# Patient Record
Sex: Female | Born: 1955 | Race: White | Hispanic: No | Marital: Married | Smoking: Never smoker
Health system: Southern US, Community
[De-identification: ages and names within clinical notes are randomized; demographics above are authoritative.]

## PROBLEM LIST (undated history)

## (undated) DIAGNOSIS — M8580 Other specified disorders of bone density and structure, unspecified site: Secondary | ICD-10-CM

## (undated) DIAGNOSIS — D219 Benign neoplasm of connective and other soft tissue, unspecified: Secondary | ICD-10-CM

## (undated) DIAGNOSIS — I340 Nonrheumatic mitral (valve) insufficiency: Secondary | ICD-10-CM

## (undated) DIAGNOSIS — C4491 Basal cell carcinoma of skin, unspecified: Secondary | ICD-10-CM

## (undated) HISTORY — DX: Benign neoplasm of connective and other soft tissue, unspecified: D21.9

## (undated) HISTORY — DX: Nonrheumatic mitral (valve) insufficiency: I34.0

## (undated) HISTORY — DX: Other specified disorders of bone density and structure, unspecified site: M85.80

## (undated) HISTORY — DX: Basal cell carcinoma of skin, unspecified: C44.91

---

## 2010-01-21 ENCOUNTER — Other Ambulatory Visit: Admission: RE | Admit: 2010-01-21 | Discharge: 2010-01-21 | Payer: Self-pay | Admitting: Family Medicine

## 2010-02-16 ENCOUNTER — Ambulatory Visit (HOSPITAL_COMMUNITY): Admission: RE | Admit: 2010-02-16 | Discharge: 2010-02-16 | Payer: Self-pay | Admitting: Family Medicine

## 2010-11-13 ENCOUNTER — Encounter: Payer: Self-pay | Admitting: Family Medicine

## 2011-01-17 ENCOUNTER — Other Ambulatory Visit (HOSPITAL_COMMUNITY)
Admission: RE | Admit: 2011-01-17 | Discharge: 2011-01-17 | Disposition: A | Discharge status: Home / Self Care | Payer: Federal, State, Local not specified - PPO | Source: Ambulatory Visit | Attending: Family Medicine | Admitting: Family Medicine

## 2011-01-17 DIAGNOSIS — Z124 Encounter for screening for malignant neoplasm of cervix: Secondary | ICD-10-CM | POA: Insufficient documentation

## 2011-01-26 ENCOUNTER — Other Ambulatory Visit (HOSPITAL_COMMUNITY): Payer: Self-pay | Admitting: Family Medicine

## 2011-01-26 DIAGNOSIS — Z1231 Encounter for screening mammogram for malignant neoplasm of breast: Secondary | ICD-10-CM

## 2011-02-20 ENCOUNTER — Ambulatory Visit (HOSPITAL_COMMUNITY)
Admission: RE | Admit: 2011-02-20 | Discharge: 2011-02-20 | Disposition: A | Discharge status: Home / Self Care | Payer: Federal, State, Local not specified - PPO | Source: Ambulatory Visit | Attending: Family Medicine | Admitting: Family Medicine

## 2011-02-20 DIAGNOSIS — Z1231 Encounter for screening mammogram for malignant neoplasm of breast: Secondary | ICD-10-CM | POA: Insufficient documentation

## 2011-12-22 HISTORY — PX: MOHS SURGERY: SUR867

## 2012-02-06 ENCOUNTER — Other Ambulatory Visit (HOSPITAL_COMMUNITY): Payer: Self-pay | Admitting: Family Medicine

## 2012-02-06 DIAGNOSIS — Z1231 Encounter for screening mammogram for malignant neoplasm of breast: Secondary | ICD-10-CM

## 2012-02-07 ENCOUNTER — Ambulatory Visit (HOSPITAL_COMMUNITY)
Admission: RE | Admit: 2012-02-07 | Discharge: 2012-02-07 | Disposition: A | Discharge status: Home / Self Care | Payer: Federal, State, Local not specified - PPO | Source: Ambulatory Visit | Attending: Family Medicine | Admitting: Family Medicine

## 2012-02-07 DIAGNOSIS — Z1231 Encounter for screening mammogram for malignant neoplasm of breast: Secondary | ICD-10-CM | POA: Insufficient documentation

## 2013-01-08 ENCOUNTER — Other Ambulatory Visit (HOSPITAL_COMMUNITY): Payer: Self-pay | Admitting: Family Medicine

## 2013-01-08 DIAGNOSIS — Z1231 Encounter for screening mammogram for malignant neoplasm of breast: Secondary | ICD-10-CM

## 2013-02-03 ENCOUNTER — Ambulatory Visit (HOSPITAL_COMMUNITY)
Admission: RE | Admit: 2013-02-03 | Discharge: 2013-02-03 | Disposition: A | Discharge status: Home / Self Care | Payer: Federal, State, Local not specified - PPO | Source: Ambulatory Visit | Attending: Family Medicine | Admitting: Family Medicine

## 2013-02-03 DIAGNOSIS — Z1231 Encounter for screening mammogram for malignant neoplasm of breast: Secondary | ICD-10-CM

## 2013-02-13 ENCOUNTER — Ambulatory Visit (HOSPITAL_COMMUNITY): Payer: Federal, State, Local not specified - PPO

## 2013-12-30 ENCOUNTER — Other Ambulatory Visit (HOSPITAL_COMMUNITY)
Admission: RE | Admit: 2013-12-30 | Discharge: 2013-12-30 | Disposition: A | Discharge status: Home / Self Care | Payer: Federal, State, Local not specified - PPO | Source: Ambulatory Visit | Attending: Family Medicine | Admitting: Family Medicine

## 2013-12-30 ENCOUNTER — Other Ambulatory Visit: Payer: Self-pay | Admitting: Family Medicine

## 2013-12-30 DIAGNOSIS — Z1151 Encounter for screening for human papillomavirus (HPV): Secondary | ICD-10-CM | POA: Insufficient documentation

## 2013-12-30 DIAGNOSIS — Z0000 Encounter for general adult medical examination without abnormal findings: Secondary | ICD-10-CM | POA: Insufficient documentation

## 2013-12-31 ENCOUNTER — Other Ambulatory Visit (HOSPITAL_COMMUNITY): Payer: Self-pay | Admitting: Family Medicine

## 2013-12-31 DIAGNOSIS — Z1231 Encounter for screening mammogram for malignant neoplasm of breast: Secondary | ICD-10-CM

## 2014-02-04 ENCOUNTER — Ambulatory Visit (HOSPITAL_COMMUNITY)
Admission: RE | Admit: 2014-02-04 | Discharge: 2014-02-04 | Disposition: A | Discharge status: Home / Self Care | Payer: Federal, State, Local not specified - PPO | Source: Ambulatory Visit | Attending: Family Medicine | Admitting: Family Medicine

## 2014-02-04 DIAGNOSIS — Z1231 Encounter for screening mammogram for malignant neoplasm of breast: Secondary | ICD-10-CM | POA: Insufficient documentation

## 2014-08-26 ENCOUNTER — Encounter: Payer: Self-pay | Admitting: *Deleted

## 2015-01-07 ENCOUNTER — Other Ambulatory Visit (HOSPITAL_COMMUNITY): Payer: Self-pay | Admitting: Family Medicine

## 2015-01-07 DIAGNOSIS — Z1231 Encounter for screening mammogram for malignant neoplasm of breast: Secondary | ICD-10-CM

## 2015-02-09 ENCOUNTER — Ambulatory Visit (HOSPITAL_COMMUNITY)
Admission: RE | Admit: 2015-02-09 | Discharge: 2015-02-09 | Disposition: A | Discharge status: Home / Self Care | Payer: Federal, State, Local not specified - PPO | Source: Ambulatory Visit | Attending: Family Medicine | Admitting: Family Medicine

## 2015-02-09 DIAGNOSIS — Z1231 Encounter for screening mammogram for malignant neoplasm of breast: Secondary | ICD-10-CM | POA: Insufficient documentation

## 2016-01-13 ENCOUNTER — Other Ambulatory Visit: Payer: Self-pay

## 2016-01-13 DIAGNOSIS — Z1231 Encounter for screening mammogram for malignant neoplasm of breast: Secondary | ICD-10-CM

## 2016-02-15 DIAGNOSIS — K080 Exfoliation of teeth due to systemic causes: Secondary | ICD-10-CM | POA: Diagnosis not present

## 2016-02-17 ENCOUNTER — Ambulatory Visit
Admission: RE | Admit: 2016-02-17 | Discharge: 2016-02-17 | Disposition: A | Discharge status: Home / Self Care | Payer: Federal, State, Local not specified - PPO | Source: Ambulatory Visit

## 2016-02-17 DIAGNOSIS — Z1231 Encounter for screening mammogram for malignant neoplasm of breast: Secondary | ICD-10-CM | POA: Diagnosis not present

## 2016-02-24 DIAGNOSIS — Z85828 Personal history of other malignant neoplasm of skin: Secondary | ICD-10-CM | POA: Diagnosis not present

## 2016-02-24 DIAGNOSIS — D2262 Melanocytic nevi of left upper limb, including shoulder: Secondary | ICD-10-CM | POA: Diagnosis not present

## 2016-02-24 DIAGNOSIS — L819 Disorder of pigmentation, unspecified: Secondary | ICD-10-CM | POA: Diagnosis not present

## 2016-02-24 DIAGNOSIS — D2261 Melanocytic nevi of right upper limb, including shoulder: Secondary | ICD-10-CM | POA: Diagnosis not present

## 2016-08-08 DIAGNOSIS — K080 Exfoliation of teeth due to systemic causes: Secondary | ICD-10-CM | POA: Diagnosis not present

## 2016-11-16 DIAGNOSIS — H25813 Combined forms of age-related cataract, bilateral: Secondary | ICD-10-CM | POA: Diagnosis not present

## 2016-11-22 DIAGNOSIS — L821 Other seborrheic keratosis: Secondary | ICD-10-CM | POA: Diagnosis not present

## 2016-11-22 DIAGNOSIS — D1801 Hemangioma of skin and subcutaneous tissue: Secondary | ICD-10-CM | POA: Diagnosis not present

## 2016-11-22 DIAGNOSIS — D2262 Melanocytic nevi of left upper limb, including shoulder: Secondary | ICD-10-CM | POA: Diagnosis not present

## 2016-11-22 DIAGNOSIS — D2222 Melanocytic nevi of left ear and external auricular canal: Secondary | ICD-10-CM | POA: Diagnosis not present

## 2016-11-22 DIAGNOSIS — Z85828 Personal history of other malignant neoplasm of skin: Secondary | ICD-10-CM | POA: Diagnosis not present

## 2016-12-29 DIAGNOSIS — Z131 Encounter for screening for diabetes mellitus: Secondary | ICD-10-CM | POA: Diagnosis not present

## 2016-12-29 DIAGNOSIS — Z13220 Encounter for screening for lipoid disorders: Secondary | ICD-10-CM | POA: Diagnosis not present

## 2016-12-29 DIAGNOSIS — M8588 Other specified disorders of bone density and structure, other site: Secondary | ICD-10-CM | POA: Diagnosis not present

## 2017-01-23 ENCOUNTER — Other Ambulatory Visit: Payer: Self-pay | Admitting: Family Medicine

## 2017-01-23 ENCOUNTER — Other Ambulatory Visit (HOSPITAL_COMMUNITY)
Admission: RE | Admit: 2017-01-23 | Discharge: 2017-01-23 | Disposition: A | Discharge status: Home / Self Care | Payer: Federal, State, Local not specified - PPO | Source: Ambulatory Visit | Attending: Family Medicine | Admitting: Family Medicine

## 2017-01-23 ENCOUNTER — Telehealth: Payer: Self-pay | Admitting: *Deleted

## 2017-01-23 DIAGNOSIS — Z124 Encounter for screening for malignant neoplasm of cervix: Secondary | ICD-10-CM | POA: Insufficient documentation

## 2017-01-23 DIAGNOSIS — Z0000 Encounter for general adult medical examination without abnormal findings: Secondary | ICD-10-CM | POA: Diagnosis not present

## 2017-01-23 DIAGNOSIS — Z23 Encounter for immunization: Secondary | ICD-10-CM | POA: Diagnosis not present

## 2017-01-23 DIAGNOSIS — Z1231 Encounter for screening mammogram for malignant neoplasm of breast: Secondary | ICD-10-CM

## 2017-01-23 NOTE — Telephone Encounter (Signed)
NOTES SENT TO SCHEDULING.  °

## 2017-01-24 ENCOUNTER — Telehealth (HOSPITAL_COMMUNITY): Payer: Self-pay | Admitting: Family Medicine

## 2017-01-26 LAB — CYTOLOGY - PAP: Diagnosis: NEGATIVE

## 2017-01-29 NOTE — Telephone Encounter (Signed)
  01/24/2017 03:27 PM Phone (Outgoing) Katrina Snow, Katrina Snow (Self) [phone] (M)   Left Message - Contacted pt to set up echo but she did not have a voicemail set up.    By Verdene Rio

## 2017-02-01 ENCOUNTER — Telehealth (HOSPITAL_COMMUNITY): Payer: Self-pay | Admitting: Family Medicine

## 2017-02-02 NOTE — Telephone Encounter (Signed)
  02/01/2017 03:10 PM Phone (Outgoing) Katrina Snow, Katrina Snow (Self) [phone] (H)   No Answer/Busy - Called pt was not able to leave a msg ..she has no VM set up.    By Verdene Rio    02/02/2017 01:23 PM Phone (Outgoing) Katrina Snow, Katrina Snow (Self) [phone] (H) Remove  No Answer/Busy - Called pt and was not able to leave msg..no VM was set up    By Verdene Rio

## 2017-02-12 ENCOUNTER — Ambulatory Visit
Admission: RE | Admit: 2017-02-12 | Discharge: 2017-02-12 | Disposition: A | Discharge status: Home / Self Care | Payer: Federal, State, Local not specified - PPO | Source: Ambulatory Visit | Attending: Family Medicine | Admitting: Family Medicine

## 2017-02-12 DIAGNOSIS — Z1231 Encounter for screening mammogram for malignant neoplasm of breast: Secondary | ICD-10-CM | POA: Diagnosis not present

## 2017-02-13 DIAGNOSIS — K080 Exfoliation of teeth due to systemic causes: Secondary | ICD-10-CM | POA: Diagnosis not present

## 2017-04-03 DIAGNOSIS — M8588 Other specified disorders of bone density and structure, other site: Secondary | ICD-10-CM | POA: Diagnosis not present

## 2017-04-05 ENCOUNTER — Telehealth: Payer: Self-pay

## 2017-04-05 NOTE — Telephone Encounter (Signed)
SENT NOTES TO SCHEDULING 

## 2017-05-02 ENCOUNTER — Encounter: Payer: Self-pay | Admitting: Internal Medicine

## 2017-05-02 ENCOUNTER — Ambulatory Visit (INDEPENDENT_AMBULATORY_CARE_PROVIDER_SITE_OTHER): Payer: Federal, State, Local not specified - PPO | Admitting: Internal Medicine

## 2017-05-02 ENCOUNTER — Encounter (INDEPENDENT_AMBULATORY_CARE_PROVIDER_SITE_OTHER): Payer: Self-pay

## 2017-05-02 VITALS — BP 146/70 | HR 79 | Ht 61.5 in | Wt 125.0 lb

## 2017-05-02 DIAGNOSIS — I059 Rheumatic mitral valve disease, unspecified: Secondary | ICD-10-CM

## 2017-05-02 NOTE — Patient Instructions (Addendum)
Your physician recommends that you continue on your current medications as directed. Please refer to the Current Medication list given to you today.  Your physician has requested that you have an echocardiogram. Echocardiography is a painless test that uses sound waves to create images of your heart. It provides your doctor with information about the size and shape of your heart and how well your heart's chambers and valves are working. This procedure takes approximately one hour. There are no restrictions for this procedure.  Follow up with your physician will depend on test results.   Addendum: gave paper request for echo report to medical records.

## 2017-05-02 NOTE — Progress Notes (Signed)
Cardiology Office Note   Date:  05/02/2017   ID:  Katrina Snow, DOB 1956/04/17, MRN [id number]  PCP:  Cari Caraway, MD  Cardiologist:   Dorris Carnes, MD   Pt referred by Dr Addison Lank for eval of mitral regurg   History of Present Illness: Katrina Snow is a 61 y.o. female with a history of mitral regurgitation  She had an echo several years ago  (? At [facility]).    She denies palpitaitons   No SOB  Rare CP  Not associated with activity      Current Meds  Medication Sig  . Calcium-Vitamin D-Vitamin K (VIACTIV) [id number] MG-UNT-MCG CHEW Chew by mouth 2 (two) times daily.  Marland Kitchen ibandronate (BONIVA) 150 MG tablet Take 150 mg by mouth every 30 (thirty) days. Take in the morning with a full glass of water, on an empty stomach, and do not take anything else by mouth or lie down for the next 30 min.     Allergies:   Patient has no known allergies.   Past Medical History:  Diagnosis Date  . Basal cell carcinoma   . Fibroids   . Mitral valve regurgitation    mild to moderate  . Osteopenia     Past Surgical History:  Procedure Laterality Date  . MOHS SURGERY  12/2011   nose for basal cell     Social History:  The patient  reports that she has never smoked. She has never used smokeless tobacco. She reports that she drinks alcohol. She reports that she does not use drugs.   Family History:  The patient's family history includes Bladder Cancer in her mother; Hypertension in her sister; Seizures in her sister.    ROS:  Please see the history of present illness. All other systems are reviewed and  Negative to the above problem except as noted.    PHYSICAL EXAM: VS:  BP (!) 146/70   Pulse 79   Ht 5' 1.5" (1.562 m)   Wt 56.7 kg (125 lb)   LMP 06/21/2010   BMI 23.24 kg/m   GEN: Well nourished, well developed, in no acute distress  HEENT: normal  Neck: no JVD, carotid bruits, or masses Cardiac: RRR;   Gr I/VI systolic mrumur back, rubs, or gallops,no edema  Respiratory:   clear to auscultation bilaterally, normal work of breathing GI: soft, nontender, nondistended, + BS  No hepatomegaly  MS: no deformity Moving all extremities   Skin: warm and dry, no rash Neuro:  Strength and sensation are intact Psych: euthymic mood, full affect   EKG:  EKG is ordered today.  SR 77 bpm     Lipid Panel No results found for: CHOL, TRIG, HDL, CHOLHDL, VLDL, LDLCALC, LDLDIRECT    Wt Readings from Last 3 Encounters:  05/02/17 56.7 kg (125 lb)      ASSESSMENT AND PLAN:  1  Mitral valve dz  Does have mild murmur on exam (back)  Would set up for echo to evaluate severity, chamber size  2  HCM   LDL 104   HDL 58  Cotinue to watch diet, stay active    Probabel f/u in 1 year      Current medicines are reviewed at length with the patient today.  The patient does not have concerns regarding medicines.  Signed, Dorris Carnes, MD  05/02/2017 10:58 AM    [address], [place], [place]  [address] Phone: [phone]; Fax: [phone]

## 2017-05-04 ENCOUNTER — Other Ambulatory Visit: Payer: Self-pay

## 2017-05-04 ENCOUNTER — Ambulatory Visit (HOSPITAL_COMMUNITY): Payer: Federal, State, Local not specified - PPO | Attending: Cardiovascular Disease

## 2017-05-04 DIAGNOSIS — I341 Nonrheumatic mitral (valve) prolapse: Secondary | ICD-10-CM | POA: Diagnosis not present

## 2017-05-04 DIAGNOSIS — I059 Rheumatic mitral valve disease, unspecified: Secondary | ICD-10-CM | POA: Diagnosis not present

## 2017-05-04 DIAGNOSIS — I340 Nonrheumatic mitral (valve) insufficiency: Secondary | ICD-10-CM | POA: Diagnosis not present

## 2017-05-04 DIAGNOSIS — I371 Nonrheumatic pulmonary valve insufficiency: Secondary | ICD-10-CM | POA: Insufficient documentation

## 2017-05-04 DIAGNOSIS — I081 Rheumatic disorders of both mitral and tricuspid valves: Secondary | ICD-10-CM | POA: Insufficient documentation

## 2017-07-31 DIAGNOSIS — Z23 Encounter for immunization: Secondary | ICD-10-CM | POA: Diagnosis not present

## 2017-08-14 DIAGNOSIS — K080 Exfoliation of teeth due to systemic causes: Secondary | ICD-10-CM | POA: Diagnosis not present

## 2017-11-01 DIAGNOSIS — Z85828 Personal history of other malignant neoplasm of skin: Secondary | ICD-10-CM | POA: Diagnosis not present

## 2017-11-01 DIAGNOSIS — L814 Other melanin hyperpigmentation: Secondary | ICD-10-CM | POA: Diagnosis not present

## 2017-11-01 DIAGNOSIS — L821 Other seborrheic keratosis: Secondary | ICD-10-CM | POA: Diagnosis not present

## 2017-11-01 DIAGNOSIS — D1801 Hemangioma of skin and subcutaneous tissue: Secondary | ICD-10-CM | POA: Diagnosis not present

## 2017-11-19 DIAGNOSIS — G43B1 Ophthalmoplegic migraine, intractable: Secondary | ICD-10-CM | POA: Diagnosis not present

## 2017-11-19 DIAGNOSIS — H43813 Vitreous degeneration, bilateral: Secondary | ICD-10-CM | POA: Diagnosis not present

## 2018-01-04 DIAGNOSIS — Z1159 Encounter for screening for other viral diseases: Secondary | ICD-10-CM | POA: Diagnosis not present

## 2018-01-04 DIAGNOSIS — Z13220 Encounter for screening for lipoid disorders: Secondary | ICD-10-CM | POA: Diagnosis not present

## 2018-01-04 DIAGNOSIS — M85852 Other specified disorders of bone density and structure, left thigh: Secondary | ICD-10-CM | POA: Diagnosis not present

## 2018-01-04 DIAGNOSIS — Z131 Encounter for screening for diabetes mellitus: Secondary | ICD-10-CM | POA: Diagnosis not present

## 2018-01-11 DIAGNOSIS — M85852 Other specified disorders of bone density and structure, left thigh: Secondary | ICD-10-CM | POA: Diagnosis not present

## 2018-01-11 DIAGNOSIS — R05 Cough: Secondary | ICD-10-CM | POA: Diagnosis not present

## 2018-01-11 DIAGNOSIS — Z0000 Encounter for general adult medical examination without abnormal findings: Secondary | ICD-10-CM | POA: Diagnosis not present

## 2018-01-21 ENCOUNTER — Other Ambulatory Visit: Payer: Self-pay | Admitting: Family Medicine

## 2018-01-21 DIAGNOSIS — Z1231 Encounter for screening mammogram for malignant neoplasm of breast: Secondary | ICD-10-CM

## 2018-02-04 DIAGNOSIS — Z1211 Encounter for screening for malignant neoplasm of colon: Secondary | ICD-10-CM | POA: Diagnosis not present

## 2018-02-04 DIAGNOSIS — Z01818 Encounter for other preprocedural examination: Secondary | ICD-10-CM | POA: Diagnosis not present

## 2018-02-12 DIAGNOSIS — K080 Exfoliation of teeth due to systemic causes: Secondary | ICD-10-CM | POA: Diagnosis not present

## 2018-02-13 ENCOUNTER — Ambulatory Visit
Admission: RE | Admit: 2018-02-13 | Discharge: 2018-02-13 | Disposition: A | Discharge status: Home / Self Care | Payer: Federal, State, Local not specified - PPO | Source: Ambulatory Visit | Attending: Family Medicine | Admitting: Family Medicine

## 2018-02-13 DIAGNOSIS — Z1231 Encounter for screening mammogram for malignant neoplasm of breast: Secondary | ICD-10-CM | POA: Diagnosis not present

## 2018-03-15 DIAGNOSIS — Z1211 Encounter for screening for malignant neoplasm of colon: Secondary | ICD-10-CM | POA: Diagnosis not present

## 2018-09-03 DIAGNOSIS — Z23 Encounter for immunization: Secondary | ICD-10-CM | POA: Diagnosis not present

## 2018-09-03 DIAGNOSIS — K080 Exfoliation of teeth due to systemic causes: Secondary | ICD-10-CM | POA: Diagnosis not present

## 2018-10-30 DIAGNOSIS — D225 Melanocytic nevi of trunk: Secondary | ICD-10-CM | POA: Diagnosis not present

## 2018-10-30 DIAGNOSIS — D2262 Melanocytic nevi of left upper limb, including shoulder: Secondary | ICD-10-CM | POA: Diagnosis not present

## 2018-10-30 DIAGNOSIS — D485 Neoplasm of uncertain behavior of skin: Secondary | ICD-10-CM | POA: Diagnosis not present

## 2018-10-30 DIAGNOSIS — D1801 Hemangioma of skin and subcutaneous tissue: Secondary | ICD-10-CM | POA: Diagnosis not present

## 2018-10-30 DIAGNOSIS — L570 Actinic keratosis: Secondary | ICD-10-CM | POA: Diagnosis not present

## 2018-10-30 DIAGNOSIS — L821 Other seborrheic keratosis: Secondary | ICD-10-CM | POA: Diagnosis not present

## 2018-10-30 DIAGNOSIS — Z85828 Personal history of other malignant neoplasm of skin: Secondary | ICD-10-CM | POA: Diagnosis not present

## 2018-11-08 DIAGNOSIS — H25813 Combined forms of age-related cataract, bilateral: Secondary | ICD-10-CM | POA: Diagnosis not present

## 2018-12-12 DIAGNOSIS — M85852 Other specified disorders of bone density and structure, left thigh: Secondary | ICD-10-CM | POA: Diagnosis not present

## 2018-12-12 DIAGNOSIS — Z136 Encounter for screening for cardiovascular disorders: Secondary | ICD-10-CM | POA: Diagnosis not present

## 2018-12-26 DIAGNOSIS — R030 Elevated blood-pressure reading, without diagnosis of hypertension: Secondary | ICD-10-CM | POA: Diagnosis not present

## 2018-12-26 DIAGNOSIS — M85852 Other specified disorders of bone density and structure, left thigh: Secondary | ICD-10-CM | POA: Diagnosis not present

## 2018-12-26 DIAGNOSIS — D259 Leiomyoma of uterus, unspecified: Secondary | ICD-10-CM | POA: Diagnosis not present

## 2018-12-26 DIAGNOSIS — Z0001 Encounter for general adult medical examination with abnormal findings: Secondary | ICD-10-CM | POA: Diagnosis not present

## 2019-01-30 ENCOUNTER — Other Ambulatory Visit: Payer: Self-pay | Admitting: Family Medicine

## 2019-01-30 DIAGNOSIS — Z1231 Encounter for screening mammogram for malignant neoplasm of breast: Secondary | ICD-10-CM

## 2019-03-31 ENCOUNTER — Other Ambulatory Visit: Payer: Self-pay

## 2019-03-31 ENCOUNTER — Ambulatory Visit
Admission: RE | Admit: 2019-03-31 | Discharge: 2019-03-31 | Disposition: A | Discharge status: Home / Self Care | Payer: Federal, State, Local not specified - PPO | Source: Ambulatory Visit | Attending: Family Medicine | Admitting: Family Medicine

## 2019-03-31 DIAGNOSIS — Z1231 Encounter for screening mammogram for malignant neoplasm of breast: Secondary | ICD-10-CM

## 2019-08-12 DIAGNOSIS — Z23 Encounter for immunization: Secondary | ICD-10-CM | POA: Diagnosis not present

## 2019-11-06 DIAGNOSIS — Z85828 Personal history of other malignant neoplasm of skin: Secondary | ICD-10-CM | POA: Diagnosis not present

## 2019-11-06 DIAGNOSIS — D2272 Melanocytic nevi of left lower limb, including hip: Secondary | ICD-10-CM | POA: Diagnosis not present

## 2019-11-06 DIAGNOSIS — B351 Tinea unguium: Secondary | ICD-10-CM | POA: Diagnosis not present

## 2019-11-11 DIAGNOSIS — H2513 Age-related nuclear cataract, bilateral: Secondary | ICD-10-CM | POA: Diagnosis not present

## 2019-12-18 DIAGNOSIS — Z13220 Encounter for screening for lipoid disorders: Secondary | ICD-10-CM | POA: Diagnosis not present

## 2019-12-18 DIAGNOSIS — M85852 Other specified disorders of bone density and structure, left thigh: Secondary | ICD-10-CM | POA: Diagnosis not present

## 2019-12-25 ENCOUNTER — Other Ambulatory Visit (HOSPITAL_COMMUNITY)
Admission: RE | Admit: 2019-12-25 | Discharge: 2019-12-25 | Disposition: A | Discharge status: Home / Self Care | Payer: Federal, State, Local not specified - PPO | Source: Ambulatory Visit | Attending: Family Medicine | Admitting: Family Medicine

## 2019-12-25 ENCOUNTER — Other Ambulatory Visit: Payer: Self-pay | Admitting: Family Medicine

## 2019-12-25 DIAGNOSIS — M85852 Other specified disorders of bone density and structure, left thigh: Secondary | ICD-10-CM | POA: Diagnosis not present

## 2019-12-25 DIAGNOSIS — Z13220 Encounter for screening for lipoid disorders: Secondary | ICD-10-CM | POA: Diagnosis not present

## 2019-12-25 DIAGNOSIS — I10 Essential (primary) hypertension: Secondary | ICD-10-CM | POA: Diagnosis not present

## 2019-12-25 DIAGNOSIS — Z23 Encounter for immunization: Secondary | ICD-10-CM | POA: Diagnosis not present

## 2019-12-25 DIAGNOSIS — Z0000 Encounter for general adult medical examination without abnormal findings: Secondary | ICD-10-CM | POA: Diagnosis not present

## 2019-12-25 DIAGNOSIS — Z124 Encounter for screening for malignant neoplasm of cervix: Secondary | ICD-10-CM | POA: Diagnosis not present

## 2019-12-25 DIAGNOSIS — M8580 Other specified disorders of bone density and structure, unspecified site: Secondary | ICD-10-CM

## 2019-12-25 DIAGNOSIS — I340 Nonrheumatic mitral (valve) insufficiency: Secondary | ICD-10-CM | POA: Diagnosis not present

## 2019-12-31 LAB — CYTOLOGY - PAP
Comment: NEGATIVE
Diagnosis: NEGATIVE
High risk HPV: NEGATIVE

## 2020-01-09 ENCOUNTER — Other Ambulatory Visit: Payer: Self-pay | Admitting: Family Medicine

## 2020-01-09 DIAGNOSIS — Z1231 Encounter for screening mammogram for malignant neoplasm of breast: Secondary | ICD-10-CM

## 2020-03-31 ENCOUNTER — Other Ambulatory Visit: Payer: Self-pay

## 2020-03-31 ENCOUNTER — Ambulatory Visit
Admission: RE | Admit: 2020-03-31 | Discharge: 2020-03-31 | Disposition: A | Discharge status: Home / Self Care | Payer: Federal, State, Local not specified - PPO | Source: Ambulatory Visit | Attending: Family Medicine | Admitting: Family Medicine

## 2020-03-31 DIAGNOSIS — M8580 Other specified disorders of bone density and structure, unspecified site: Secondary | ICD-10-CM

## 2020-03-31 DIAGNOSIS — Z780 Asymptomatic menopausal state: Secondary | ICD-10-CM | POA: Diagnosis not present

## 2020-03-31 DIAGNOSIS — M8589 Other specified disorders of bone density and structure, multiple sites: Secondary | ICD-10-CM | POA: Diagnosis not present

## 2020-03-31 DIAGNOSIS — Z1231 Encounter for screening mammogram for malignant neoplasm of breast: Secondary | ICD-10-CM

## 2020-08-10 IMAGING — MG DIGITAL SCREENING BILAT W/ TOMO W/ CAD
8 series · 9 of 24 positions shown · non-contrast
Comparison: Previous exam(s).

CLINICAL DATA: Screening.

EXAM:
DIGITAL SCREENING BILATERAL MAMMOGRAM WITH TOMO AND CAD

[L MLO synth-2D]
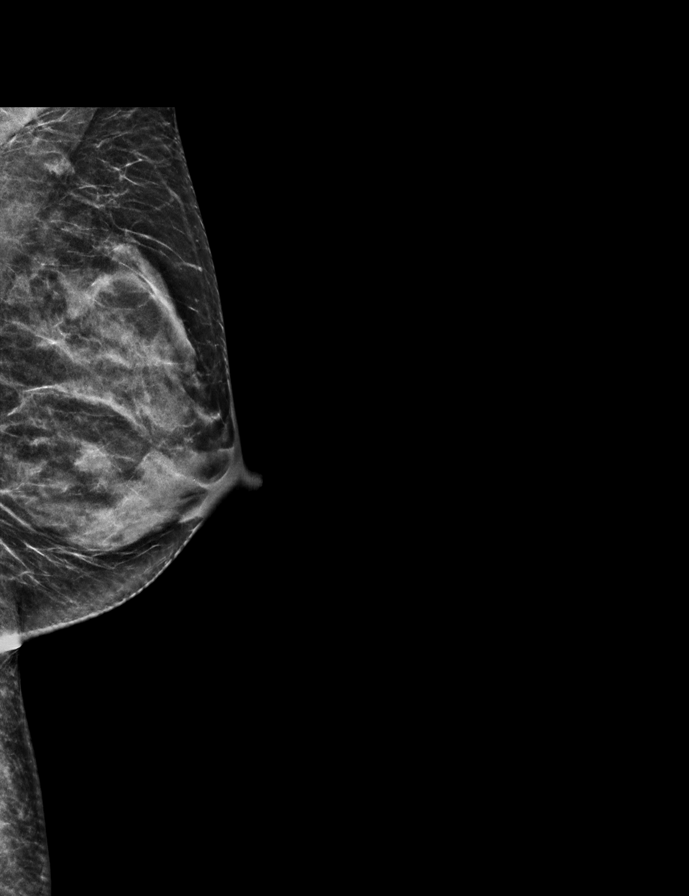

[R CC synth-2D]
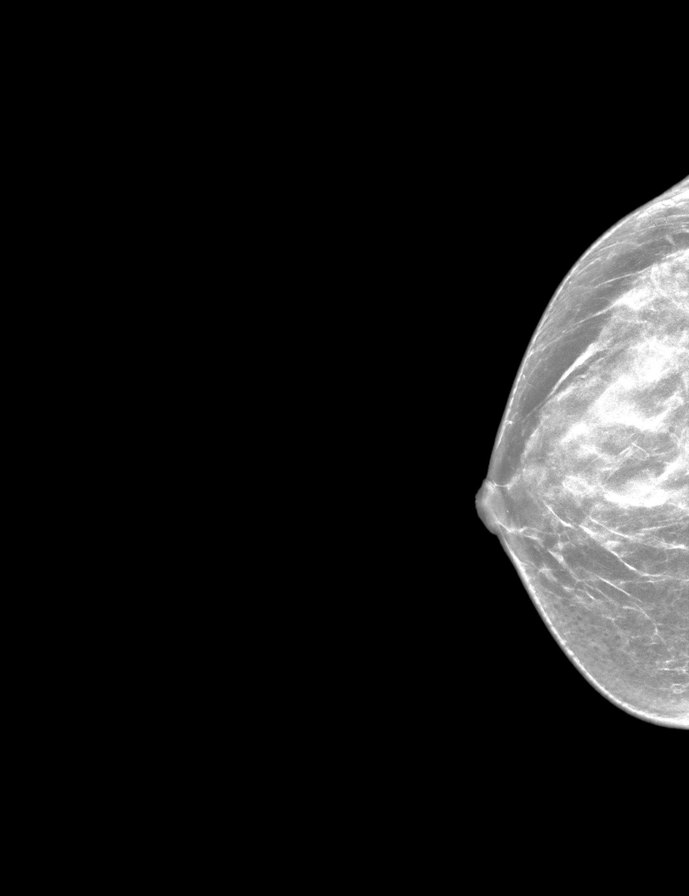

[R MLO synth-2D]
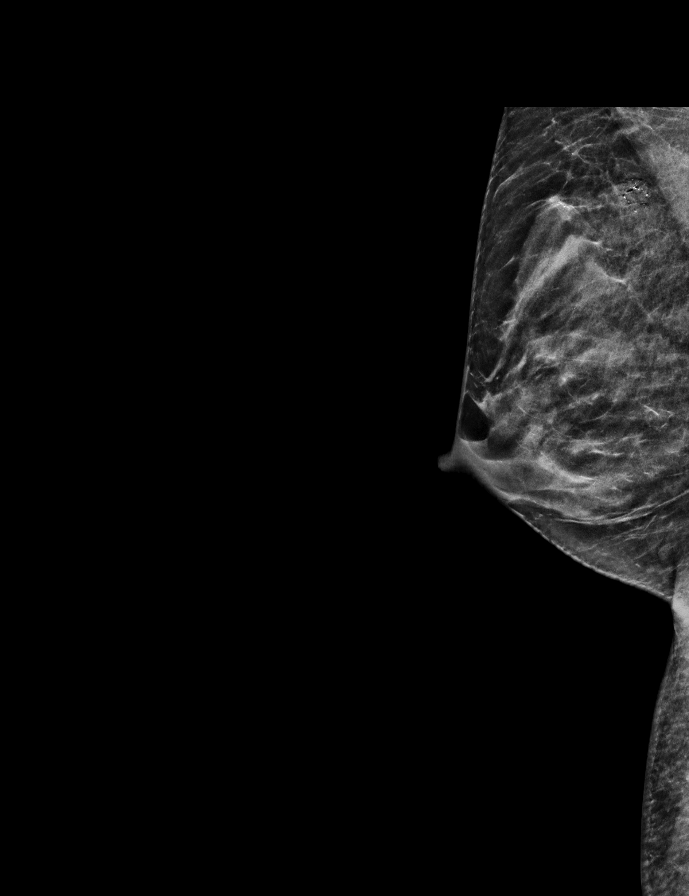

[L CC synth-2D]
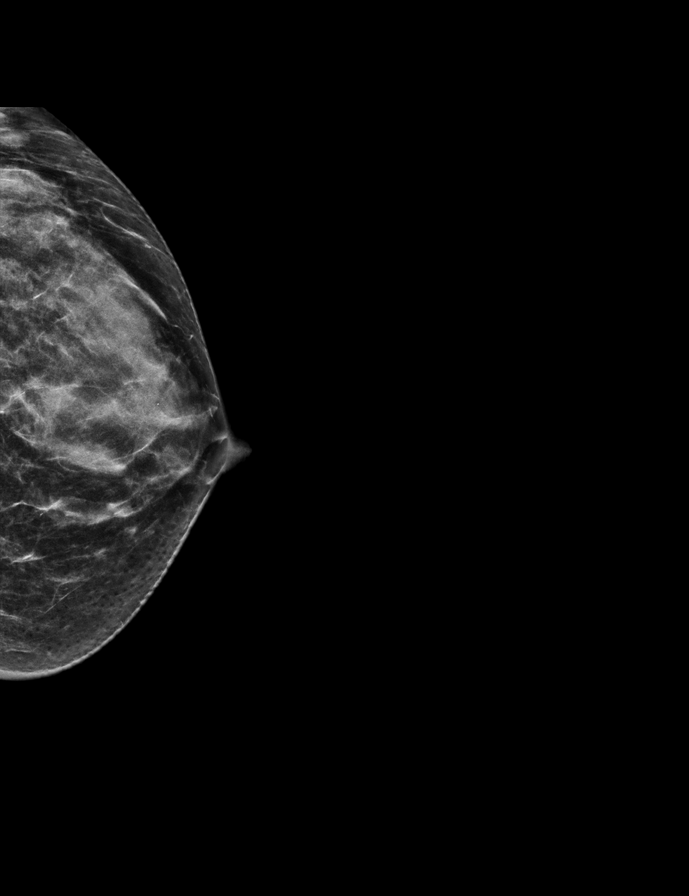

[L MLO tomo · 2 of 52 frames shown]
[frame 17/52]
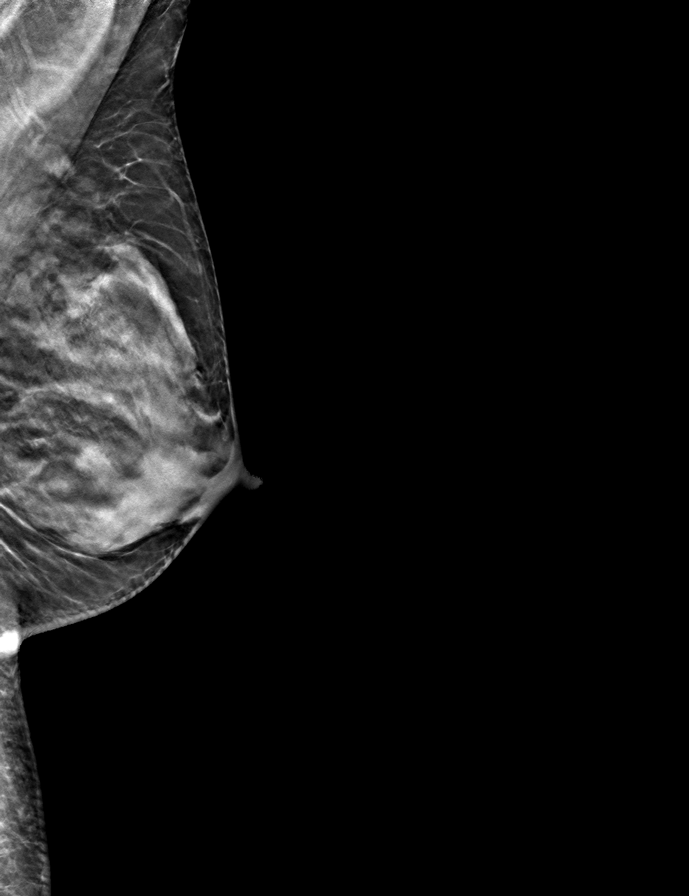
[frame 27/52]
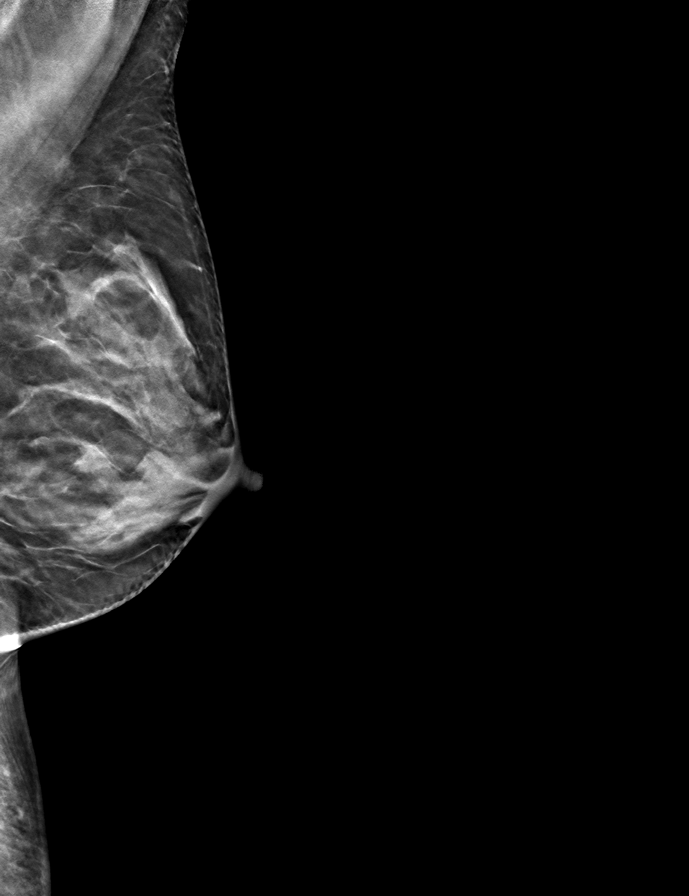

[R MLO tomo · tomo slice 26/51.0]
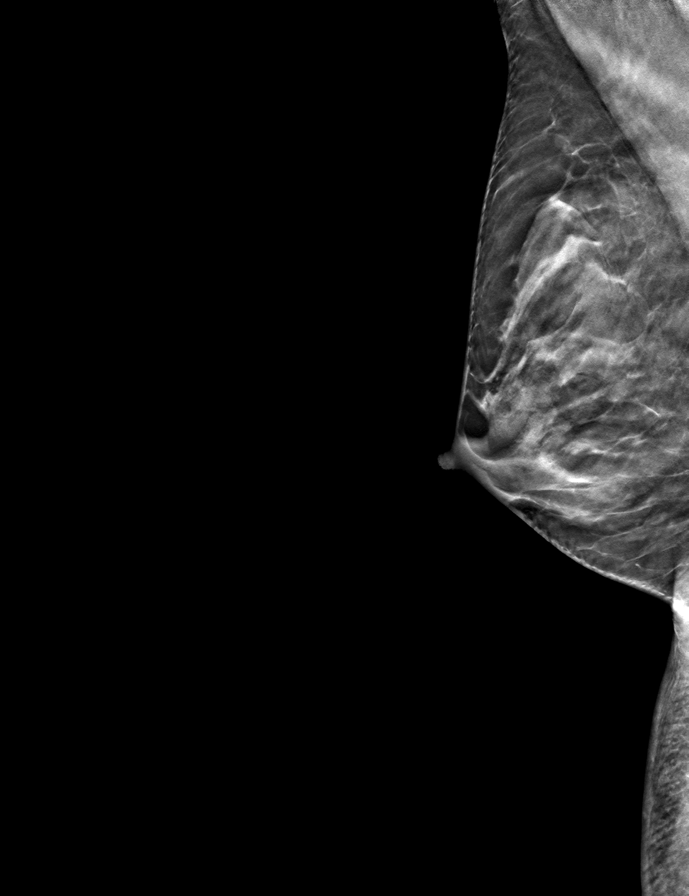

[L CC tomo · tomo slice 27/53.0]
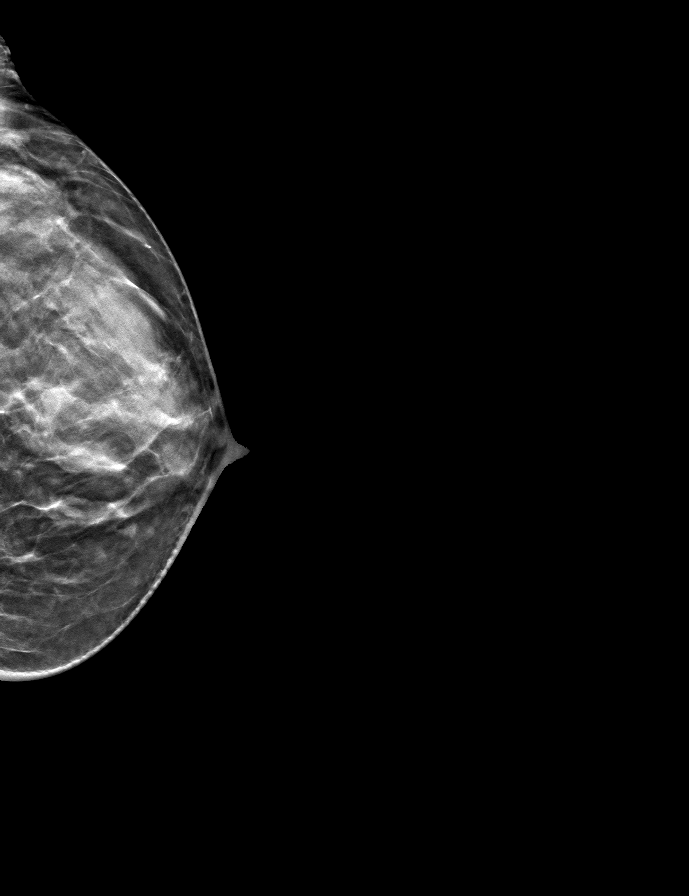

[R CC tomo · tomo slice 29/56.0]
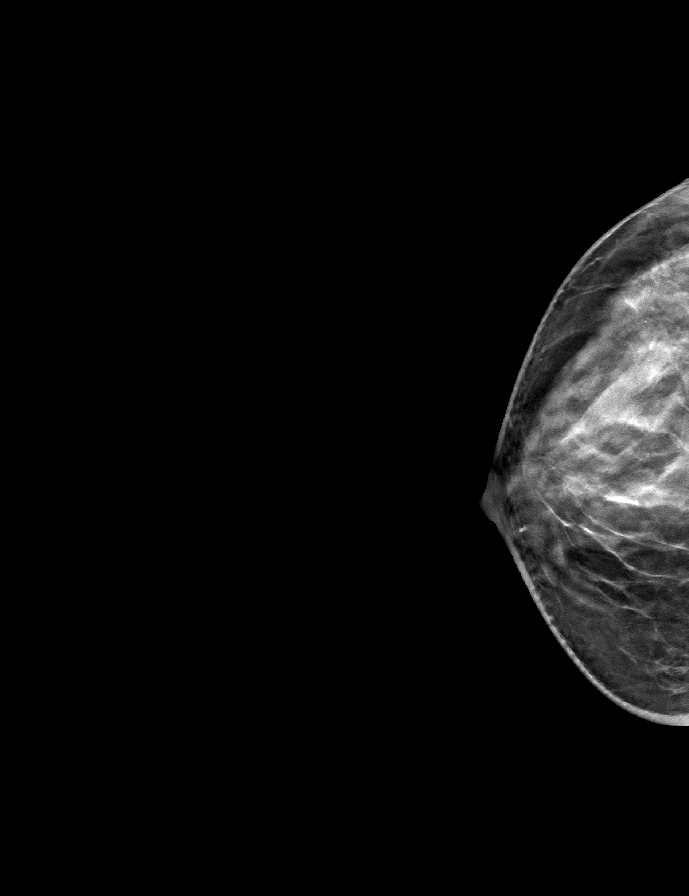

[9 of 24 positions shown; findings below may reference images not displayed]

ACR Breast Density Category c: The breast tissue is heterogeneously
dense, which may obscure small masses.
FINDINGS: There are no findings suspicious for malignancy. Images were
processed with CAD.
IMPRESSION: No mammographic evidence of malignancy. A result letter of this
screening mammogram will be mailed directly to the patient.

RECOMMENDATION:
Screening mammogram in one year. (Code:[id number])

BI-RADS CATEGORY  1: Negative.

## 2020-11-02 DIAGNOSIS — H2513 Age-related nuclear cataract, bilateral: Secondary | ICD-10-CM | POA: Diagnosis not present

## 2020-11-02 DIAGNOSIS — H5213 Myopia, bilateral: Secondary | ICD-10-CM | POA: Diagnosis not present

## 2020-11-04 DIAGNOSIS — L814 Other melanin hyperpigmentation: Secondary | ICD-10-CM | POA: Diagnosis not present

## 2020-11-04 DIAGNOSIS — L820 Inflamed seborrheic keratosis: Secondary | ICD-10-CM | POA: Diagnosis not present

## 2020-11-04 DIAGNOSIS — L570 Actinic keratosis: Secondary | ICD-10-CM | POA: Diagnosis not present

## 2020-11-04 DIAGNOSIS — Z85828 Personal history of other malignant neoplasm of skin: Secondary | ICD-10-CM | POA: Diagnosis not present

## 2020-11-04 DIAGNOSIS — L821 Other seborrheic keratosis: Secondary | ICD-10-CM | POA: Diagnosis not present

## 2020-11-04 DIAGNOSIS — D1801 Hemangioma of skin and subcutaneous tissue: Secondary | ICD-10-CM | POA: Diagnosis not present

## 2020-12-20 DIAGNOSIS — I10 Essential (primary) hypertension: Secondary | ICD-10-CM | POA: Diagnosis not present

## 2020-12-20 DIAGNOSIS — R7301 Impaired fasting glucose: Secondary | ICD-10-CM | POA: Diagnosis not present

## 2020-12-20 DIAGNOSIS — M85852 Other specified disorders of bone density and structure, left thigh: Secondary | ICD-10-CM | POA: Diagnosis not present

## 2020-12-20 DIAGNOSIS — Z13220 Encounter for screening for lipoid disorders: Secondary | ICD-10-CM | POA: Diagnosis not present

## 2020-12-30 DIAGNOSIS — D259 Leiomyoma of uterus, unspecified: Secondary | ICD-10-CM | POA: Diagnosis not present

## 2020-12-30 DIAGNOSIS — R7303 Prediabetes: Secondary | ICD-10-CM | POA: Diagnosis not present

## 2020-12-30 DIAGNOSIS — I10 Essential (primary) hypertension: Secondary | ICD-10-CM | POA: Diagnosis not present

## 2020-12-30 DIAGNOSIS — Z0000 Encounter for general adult medical examination without abnormal findings: Secondary | ICD-10-CM | POA: Diagnosis not present

## 2020-12-30 DIAGNOSIS — M85852 Other specified disorders of bone density and structure, left thigh: Secondary | ICD-10-CM | POA: Diagnosis not present

## 2021-02-15 ENCOUNTER — Other Ambulatory Visit: Payer: Self-pay | Admitting: Family Medicine

## 2021-02-15 DIAGNOSIS — Z1231 Encounter for screening mammogram for malignant neoplasm of breast: Secondary | ICD-10-CM

## 2021-02-23 DIAGNOSIS — R059 Cough, unspecified: Secondary | ICD-10-CM | POA: Diagnosis not present

## 2021-04-05 ENCOUNTER — Other Ambulatory Visit: Payer: Self-pay

## 2021-04-05 ENCOUNTER — Ambulatory Visit
Admission: RE | Admit: 2021-04-05 | Discharge: 2021-04-05 | Disposition: A | Discharge status: Home / Self Care | Payer: Medicare Other | Source: Ambulatory Visit | Attending: Family Medicine | Admitting: Family Medicine

## 2021-04-05 DIAGNOSIS — Z1231 Encounter for screening mammogram for malignant neoplasm of breast: Secondary | ICD-10-CM

## 2022-01-05 ENCOUNTER — Other Ambulatory Visit: Payer: Self-pay | Admitting: Family Medicine

## 2022-01-25 ENCOUNTER — Ambulatory Visit (INDEPENDENT_AMBULATORY_CARE_PROVIDER_SITE_OTHER): Payer: Medicare Other | Admitting: Pulmonary Disease

## 2022-01-25 ENCOUNTER — Encounter: Payer: Self-pay | Admitting: Pulmonary Disease

## 2022-01-25 ENCOUNTER — Ambulatory Visit (INDEPENDENT_AMBULATORY_CARE_PROVIDER_SITE_OTHER): Payer: Medicare Other

## 2022-01-25 VITALS — BP 128/88 | HR 84 | Ht 61.5 in | Wt 116.4 lb

## 2022-01-25 DIAGNOSIS — R051 Acute cough: Secondary | ICD-10-CM

## 2022-01-25 DIAGNOSIS — R053 Chronic cough: Secondary | ICD-10-CM | POA: Diagnosis not present

## 2022-01-25 MED ORDER — PANTOPRAZOLE SODIUM 40 MG PO TBEC: 40.0000 mg | DELAYED_RELEASE_TABLET | Freq: Every day | ORAL | 0 refills | Status: DC

## 2022-01-25 NOTE — Patient Instructions (Addendum)
Chronic cough ?--ORDER CXR ?--ORDER pulmonary function tests ?--Reflux: START omeprazole 40 mg once a day. Buy over-the-counter ? ?Follow-up with me in late May. PFTs prior to visit. Ok to schedule separately ?

## 2022-01-25 NOTE — Progress Notes (Signed)
6 ? ? ?Subjective:  ? ?PATIENT ID: Katrina Snow GENDER: female DOB: February 15, 1956, MRN: [id number] ? ? ?HPI ? ?Chief Complaint  ?Patient presents with  ? Consult  ?  Happens mostly when she's sitting, posible acid reflux   ? ? ?Reason for Visit: New consult for cough ? ?Ms. Katrina Snow is a 66 year old female never smoker with osteopenia, hypertension nonrheumatic mitral regurgitation depression who presents for evaluation of cough. ? ?She reports longstanding cough for several years. Has tried pepcid a long time ago a month ago. Didn't work. Attempted diet changes to reduced acidic foods and caffeine. Denies reflux symptoms. Cough seems to occur with sitting for spells lasting 5 minutes. Does seem to occur with standing. Will have hacking cough intermittently during the day. Denies wheezing or shortness of breath. Denies nocturnal symptoms however notes she is on several pillows and usually has difficulty sleeping. She works 160 minutes a week.  Will have to sit at the back of church due to coughing. Worries about her symptoms when going to a show. Denies nasal congestion. Denies chronic respiratory infection. ? 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 ?I have personally reviewed patient's past medical/family/social history, allergies, current medications. ? ?Past Medical History:  ?Diagnosis Date  ? Basal cell carcinoma   ? Fibroids   ? Mitral valve regurgitation   ? mild to moderate  ? Osteopenia   ?  ? ?Family History  ?Problem Relation Age of Onset  ? Bladder Cancer Mother   ? Breast cancer Mother 82  ? Seizures Sister   ? Hypertension Sister   ? Anemia Neg Hx   ? Arrhythmia Neg Hx   ? Asthma Neg Hx   ? Clotting disorder Neg Hx   ? Fainting Neg Hx   ? Heart attack Neg Hx   ? Heart disease Neg Hx   ? Heart failure Neg Hx   ? Hyperlipidemia Neg Hx   ?  ? ?Social History  ? ?Occupational History  ? Not on file  ?Tobacco Use  ? Smoking status: Never  ? Smokeless tobacco: Never  ?Substance and Sexual  Activity  ? Alcohol use: Yes  ?  Alcohol/week: 0.0 standard drinks  ? Drug use: No  ? Sexual activity: Not on file  ? ? ?No Known Allergies  ? ?Outpatient Medications Prior to Visit  ?Medication Sig Dispense Refill  ? hydrochlorothiazide (HYDRODIURIL) 25 MG tablet TAKE 1 TABLET BY MOUTH EVERY DAY IN THE MORNING FOR 90 DAYS    ? Calcium-Vitamin D-Vitamin K [phone] MG-UNT-MCG CHEW Chew by mouth 2 (two) times daily.    ? ibandronate (BONIVA) 150 MG tablet Take 150 mg by mouth every 30 (thirty) days. Take in the morning with a full glass of water, on an empty stomach, and do not take anything else by mouth or lie down for the next 30 min.    ? ?No facility-administered medications prior to visit.  ? ? ?Review of Systems  ?Constitutional:  Negative for chills, diaphoresis, fever, malaise/fatigue and weight loss.  ?HENT:  Negative for congestion.   ?Respiratory:  Positive for cough. Negative for hemoptysis, sputum production, shortness of breath and wheezing.   ?Cardiovascular:  Negative for chest pain, palpitations and leg swelling.  ? ? ?Objective:  ? ?Vitals:  ? 01/25/22 1134  ?BP: 128/88  ?Pulse: 84  ?SpO2: 100%  ?Weight: 116 lb 6.4 oz (52.8 kg)  ?Height: 5' 1.5" (1.562 m)  ? ?SpO2: 100 % ?O2 Device: None (Room air) ? ?Physical  Exam: ?General: Well-appearing, no acute distress ?HENT: [place], AT ?Eyes: EOMI, no scleral icterus ?Respiratory: Clear to auscultation bilaterally.  No crackles, wheezing or rales ?Cardiovascular: RRR, -M/R/G, no JVD ?Extremities:-Edema,-tenderness ?Neuro: AAO x4, CNII-XII grossly intact ?Psych: Normal mood, normal affect ? ?Data Reviewed: ? ?Imaging: ?CXR 01/25/22 - Minimal bronchitic changes. Overall normal ? ?PFT: ?None on file ? ?Labs: ?CBC ?No results found for: WBC, RBC, HGB, HCT, PLT, MCV, MCH, MCHC, RDW, LYMPHSABS, MONOABS, EOSABS, BASOSABS ? ?Health Maintenance: ?Immunization History  ?Administered Date(s) Administered  ? Influenza Split 08/12/2020, 08/11/2021  ? Influenza,inj,Quad PF,6+  Mos 07/31/2017, 09/03/2018, 08/12/2019  ? PFIZER(Purple Top)SARS-COV-2 Vaccination 01/05/2020, 01/26/2020, 07/27/2020, 01/29/2021  ? PNEUMOCOCCAL CONJUGATE-20 01/02/2022  ? Pension scheme manager 6yr & up 07/26/2021  ? Tdap 01/03/2010, 12/25/2019  ? Zoster, Live 01/23/2017, 07/16/2017  ? ?Assessment & Plan:  ? ?Discussion: ?66year old female never smoker with osteopenia, hypertension nonrheumatic mitral regurgitation depression who presents for evaluation of cough. Common causes of cough were discussed including upper airway cough syndrome, reflux and undiagnosed obstructive lung disease. We reviewed evaluation and management for cough as noted below. Reviewed CXR which is overall normal. Discussed management for silent reflux and evaluation of lung function ? ?Chronic cough ?--ORDER CXR ?--ORDER pulmonary function tests ?--Reflux: START omeprazole 40 mg once a day. Buy over-the-counter ? ?Orders Placed This Encounter  ?Procedures  ? DG Chest 2 View  ?  Rule out hiatal hernia  ?  Standing Status:   Future  ?  Standing Expiration Date:   01/26/2023  ?  Order Specific Question:   Reason for Exam (SYMPTOM  OR DIAGNOSIS REQUIRED)  ?  Answer:   cough  ?  Order Specific Question:   Preferred imaging location?  ?  Answer:   Internal  ? ?Meds ordered this encounter  ?Medications  ? pantoprazole (PROTONIX) 40 MG tablet  ?  Sig: Take 1 tablet (40 mg total) by mouth daily.  ?  Dispense:  90 tablet  ?  Refill:  0  ? ? ?Return in about 2 months (around 03/27/2022). ? ?I have spent a total time of 45-minutes on the day of the appointment reviewing prior documentation, coordinating care and discussing medical diagnosis and plan with the patient/family. Imaging, labs and tests included in this note have been reviewed and interpreted independently by me. ? ?Cullen Vanallen JRodman Pickle MD ?[facility] ?01/25/2022 12:01 PM  ?Office Number [phone]? ? ?

## 2022-01-26 ENCOUNTER — Other Ambulatory Visit: Payer: Self-pay | Admitting: Pulmonary Disease

## 2022-01-30 ENCOUNTER — Other Ambulatory Visit (HOSPITAL_COMMUNITY): Payer: Self-pay

## 2022-01-31 ENCOUNTER — Other Ambulatory Visit (HOSPITAL_COMMUNITY): Payer: Self-pay

## 2022-02-01 ENCOUNTER — Encounter: Payer: Self-pay | Admitting: Pulmonary Disease

## 2022-02-01 NOTE — Telephone Encounter (Signed)
Please call pharmacy and cancel omeprazole prescription ?

## 2022-02-01 NOTE — Telephone Encounter (Signed)
ERROR

## 2022-02-01 NOTE — Telephone Encounter (Signed)
Rx for omeprazole '40mg'$  has been sent to pharmacy in place of lansoprazole. Nothing further needed. ?

## 2022-02-01 NOTE — Telephone Encounter (Signed)
Called [facility] and canceled Omeperzole. Notified pt via My Chart. Nothing further needed at this time.  ?

## 2022-02-01 NOTE — Telephone Encounter (Signed)
Dr. Florina Ou please advise on the following My Chart message:  ?[facility] (supporting Margaretha Seeds, MD) 9 minutes ago (11:17 AM)  ? ?I started taking Pantoprazole last week following my visit with you.  Today I received a call from the pharmacy to pick up Omeprazole.  Did you want me to take both medications?  That seems like a lot of reflux meds at one time.  Please advise.  Thank you ? ?Thank you  ?

## 2022-02-02 ENCOUNTER — Encounter: Payer: Self-pay | Admitting: Pulmonary Disease

## 2022-03-01 ENCOUNTER — Ambulatory Visit (INDEPENDENT_AMBULATORY_CARE_PROVIDER_SITE_OTHER): Payer: Medicare Other | Admitting: Pulmonary Disease

## 2022-03-01 DIAGNOSIS — R053 Chronic cough: Secondary | ICD-10-CM

## 2022-03-01 LAB — PULMONARY FUNCTION TEST
DL/VA % pred: 110 %
DL/VA: 4.71 ml/min/mmHg/L
DLCO cor % pred: 108 %
DLCO cor: 19.8 ml/min/mmHg
DLCO unc % pred: 108 %
DLCO unc: 19.8 ml/min/mmHg
FEF 25-75 Post: 2.28 L/sec
FEF 25-75 Pre: 1.77 L/sec
FEF2575-%Change-Post: 28 %
FEF2575-%Pred-Post: 114 %
FEF2575-%Pred-Pre: 88 %
FEV1-%Change-Post: 7 %
FEV1-%Pred-Post: 96 %
FEV1-%Pred-Pre: 89 %
FEV1-Post: 2.1 L
FEV1-Pre: 1.96 L
FEV1FVC-%Change-Post: 3 %
FEV1FVC-%Pred-Pre: 102 %
FEV6-%Change-Post: 3 %
FEV6-%Pred-Post: 93 %
FEV6-%Pred-Pre: 90 %
FEV6-Post: 2.56 L
FEV6-Pre: 2.49 L
FEV6FVC-%Pred-Post: 104 %
FEV6FVC-%Pred-Pre: 104 %
FVC-%Change-Post: 3 %
FVC-%Pred-Post: 89 %
FVC-%Pred-Pre: 86 %
FVC-Post: 2.56 L
FVC-Pre: 2.49 L
Post FEV1/FVC ratio: 82 %
Post FEV6/FVC ratio: 100 %
Pre FEV1/FVC ratio: 79 %
Pre FEV6/FVC Ratio: 100 %
RV % pred: 81 %
RV: 1.6 L
TLC % pred: 86 %
TLC: 4.07 L

## 2022-03-01 NOTE — Patient Instructions (Signed)
Performed Full PFT Today.   ?

## 2022-03-01 NOTE — Progress Notes (Signed)
Performed Full PFT Today.   ?

## 2022-03-13 ENCOUNTER — Ambulatory Visit (INDEPENDENT_AMBULATORY_CARE_PROVIDER_SITE_OTHER): Payer: Medicare Other | Admitting: Pulmonary Disease

## 2022-03-13 ENCOUNTER — Encounter: Payer: Self-pay | Admitting: Pulmonary Disease

## 2022-03-13 VITALS — BP 124/70 | HR 82 | Temp 98.3°F | Ht 61.0 in | Wt 116.0 lb

## 2022-03-13 DIAGNOSIS — R053 Chronic cough: Secondary | ICD-10-CM | POA: Diagnosis not present

## 2022-03-13 MED ORDER — FLUTICASONE FUROATE-VILANTEROL 100-25 MCG/ACT IN AEPB: 1.0000 | INHALATION_SPRAY | Freq: Every day | RESPIRATORY_TRACT | 1 refills | Status: DC

## 2022-03-13 NOTE — Progress Notes (Signed)
6   Subjective:   PATIENT ID: Katrina Snow GENDER: female DOB: 1956-10-14, MRN: [id number]   HPI  Chief Complaint  Patient presents with   Follow-up    PFT results    Reason for Visit: Follow-up  Ms. Katrina Snow is a 66 year old female never smoker with osteopenia, hypertension nonrheumatic mitral regurgitation depression who presents for follow-up.  Initial consult She reports longstanding cough for several years. Has tried pepcid a long time ago a month ago. Didn't work. Attempted diet changes to reduced acidic foods and caffeine. Denies reflux symptoms. Cough seems to occur with sitting for spells lasting 5 minutes. Does seem to occur with standing. Will have hacking cough intermittently during the day. Denies wheezing or shortness of breath. Denies nocturnal symptoms however notes she is on several pillows and usually has difficulty sleeping. She works 160 minutes a week.  Will have to sit at the back of church due to coughing. Worries about her symptoms when going to a show. Denies nasal congestion. Denies chronic respiratory infection.  03/13/22 Since our last visit her cough has improved with less frequency with minor spells related to throat irritation. Usually occurs in the morning. Still has some night cough 1-2 times a month.  Social History: Retired Electrical engineer   Past Medical History:  Diagnosis Date   Basal cell carcinoma    Fibroids    Mitral valve regurgitation    mild to moderate   Osteopenia      Family History  Problem Relation Age of Onset   Bladder Cancer Mother    Breast cancer Mother 52   Seizures Sister    Hypertension Sister    Anemia Neg Hx    Arrhythmia Neg Hx    Asthma Neg Hx    Clotting disorder Neg Hx    Fainting Neg Hx    Heart attack Neg Hx    Heart disease Neg Hx    Heart failure Neg Hx    Hyperlipidemia Neg Hx      Social History   Occupational History   Not on file  Tobacco Use   Smoking status: Never    Smokeless tobacco: Never  Substance and Sexual Activity   Alcohol use: Yes    Alcohol/week: 0.0 standard drinks   Drug use: No   Sexual activity: Not on file    Allergies  Allergen Reactions   Hydrochlorothiazide      Outpatient Medications Prior to Visit  Medication Sig Dispense Refill   hydrochlorothiazide (HYDRODIURIL) 25 MG tablet TAKE 1 TABLET BY MOUTH EVERY DAY IN THE MORNING FOR 90 DAYS     omeprazole (PRILOSEC) 40 MG capsule Take 1 capsule (40 mg total) by mouth daily. 30 capsule 5   Calcium-Vitamin D-Vitamin K [phone] MG-UNT-MCG CHEW Chew by mouth 2 (two) times daily.     ibandronate (BONIVA) 150 MG tablet Take 150 mg by mouth every 30 (thirty) days. Take in the morning with a full glass of water, on an empty stomach, and do not take anything else by mouth or lie down for the next 30 min.     No facility-administered medications prior to visit.    Review of Systems  Constitutional:  Negative for chills, diaphoresis, fever, malaise/fatigue and weight loss.  HENT:  Negative for congestion.   Respiratory:  Positive for cough. Negative for hemoptysis, sputum production, shortness of breath and wheezing.   Cardiovascular:  Negative for chest pain, palpitations and leg swelling.    Objective:   Vitals:  03/13/22 1052  BP: 124/70  Pulse: 82  Temp: 98.3 F (36.8 C)  TempSrc: Oral  SpO2: 99%  Weight: 116 lb (52.6 kg)  Height: '5\' 1"'$  (1.549 m)    SpO2: 99 %  Physical Exam: General: Well-appearing, no acute distress HENT: [place], AT Eyes: EOMI, no scleral icterus Respiratory: Clear to auscultation bilaterally.  No crackles, wheezing or rales Cardiovascular: RRR, -M/R/G, no JVD Extremities:-Edema,-tenderness Neuro: AAO x4, CNII-XII grossly intact Psych: Normal mood, normal affect  Data Reviewed:  Imaging: CXR 01/25/22 - Minimal bronchitic changes. Overall normal  PFT: 03/01/22 FVC 2.56 (89%) FEV1 2.1 (96%) Ratio 79  TLC 86% DLCO 108%. Partial bronchodilator  response however not significant. This does not preclude benefit of inhalers if perceived. Interpretation: Normal PFTs   Labs: CBC No results found for: WBC, RBC, HGB, HCT, PLT, MCV, MCH, MCHC, RDW, LYMPHSABS, MONOABS, EOSABS, BASOSABS  Health Maintenance: Immunization History  Administered Date(s) Administered   Influenza Split 08/12/2020, 08/11/2021   Influenza,inj,Quad PF,6+ Mos 07/31/2017, 09/03/2018, 08/12/2019   PFIZER(Purple Top)SARS-COV-2 Vaccination 01/05/2020, 01/26/2020, 07/27/2020, 01/29/2021   PNEUMOCOCCAL CONJUGATE-20 01/02/2022   Pfizer Covid-19 Vaccine Bivalent Booster 59yr & up 07/26/2021, 03/06/2022   Tdap 01/03/2010, 12/25/2019   Zoster, Live 01/23/2017, 07/16/2017   Assessment & Plan:   Discussion: 66year old female never smoker with osteopenia, HTN, nonrheumatic mitral regurgitation, depression who presents for follow-up for cough. PFTs reviewed with no evidence of obstructive or restrictive defect. Partial bronchodilator response present however not significant for diagnosis of asthma. May still benefit from ICS/LABA trial. Will continue PPI as well until prescription is complete. Will arrange follow-up however if symptoms resolve, ok to cancel.  Chronic cough --START Breo 100-25 ONE puff ONCE a day --CONTINUE omeprazole 40 mg once a day. Buy over-the-counter  No orders of the defined types were placed in this encounter.  Meds ordered this encounter  Medications   fluticasone furoate-vilanterol (BREO ELLIPTA) 100-25 MCG/ACT AEPB    Sig: Inhale 1 puff into the lungs daily.    Dispense:  60 each    Refill:  1    Return in about 3 months (around 06/13/2022).  I have spent a total time of 32-minutes on the day of the appointment including chart review, data review, collecting history, coordinating care and discussing medical diagnosis and plan with the patient/family. Past medical history, allergies, medications were reviewed. Pertinent imaging, labs and  tests included in this note have been reviewed and interpreted independently by me.  CHenderson MD [facility] 03/13/2022 5:20 PM  Office Number [phone]

## 2022-03-13 NOTE — Patient Instructions (Signed)
Chronic cough --START Breo 100-25 ONE puff ONCE a day --CONTINUE omeprazole 40 mg once a day. Buy over-the-counter  Follow-up with me in 3 months

## 2022-06-06 IMAGING — DX DG CHEST 2V
2 series · 2 of 2 positions shown · non-contrast
Comparison: 02/23/2021

CLINICAL DATA: Cough

EXAM:
CHEST - 2 VIEW

[chest pa]
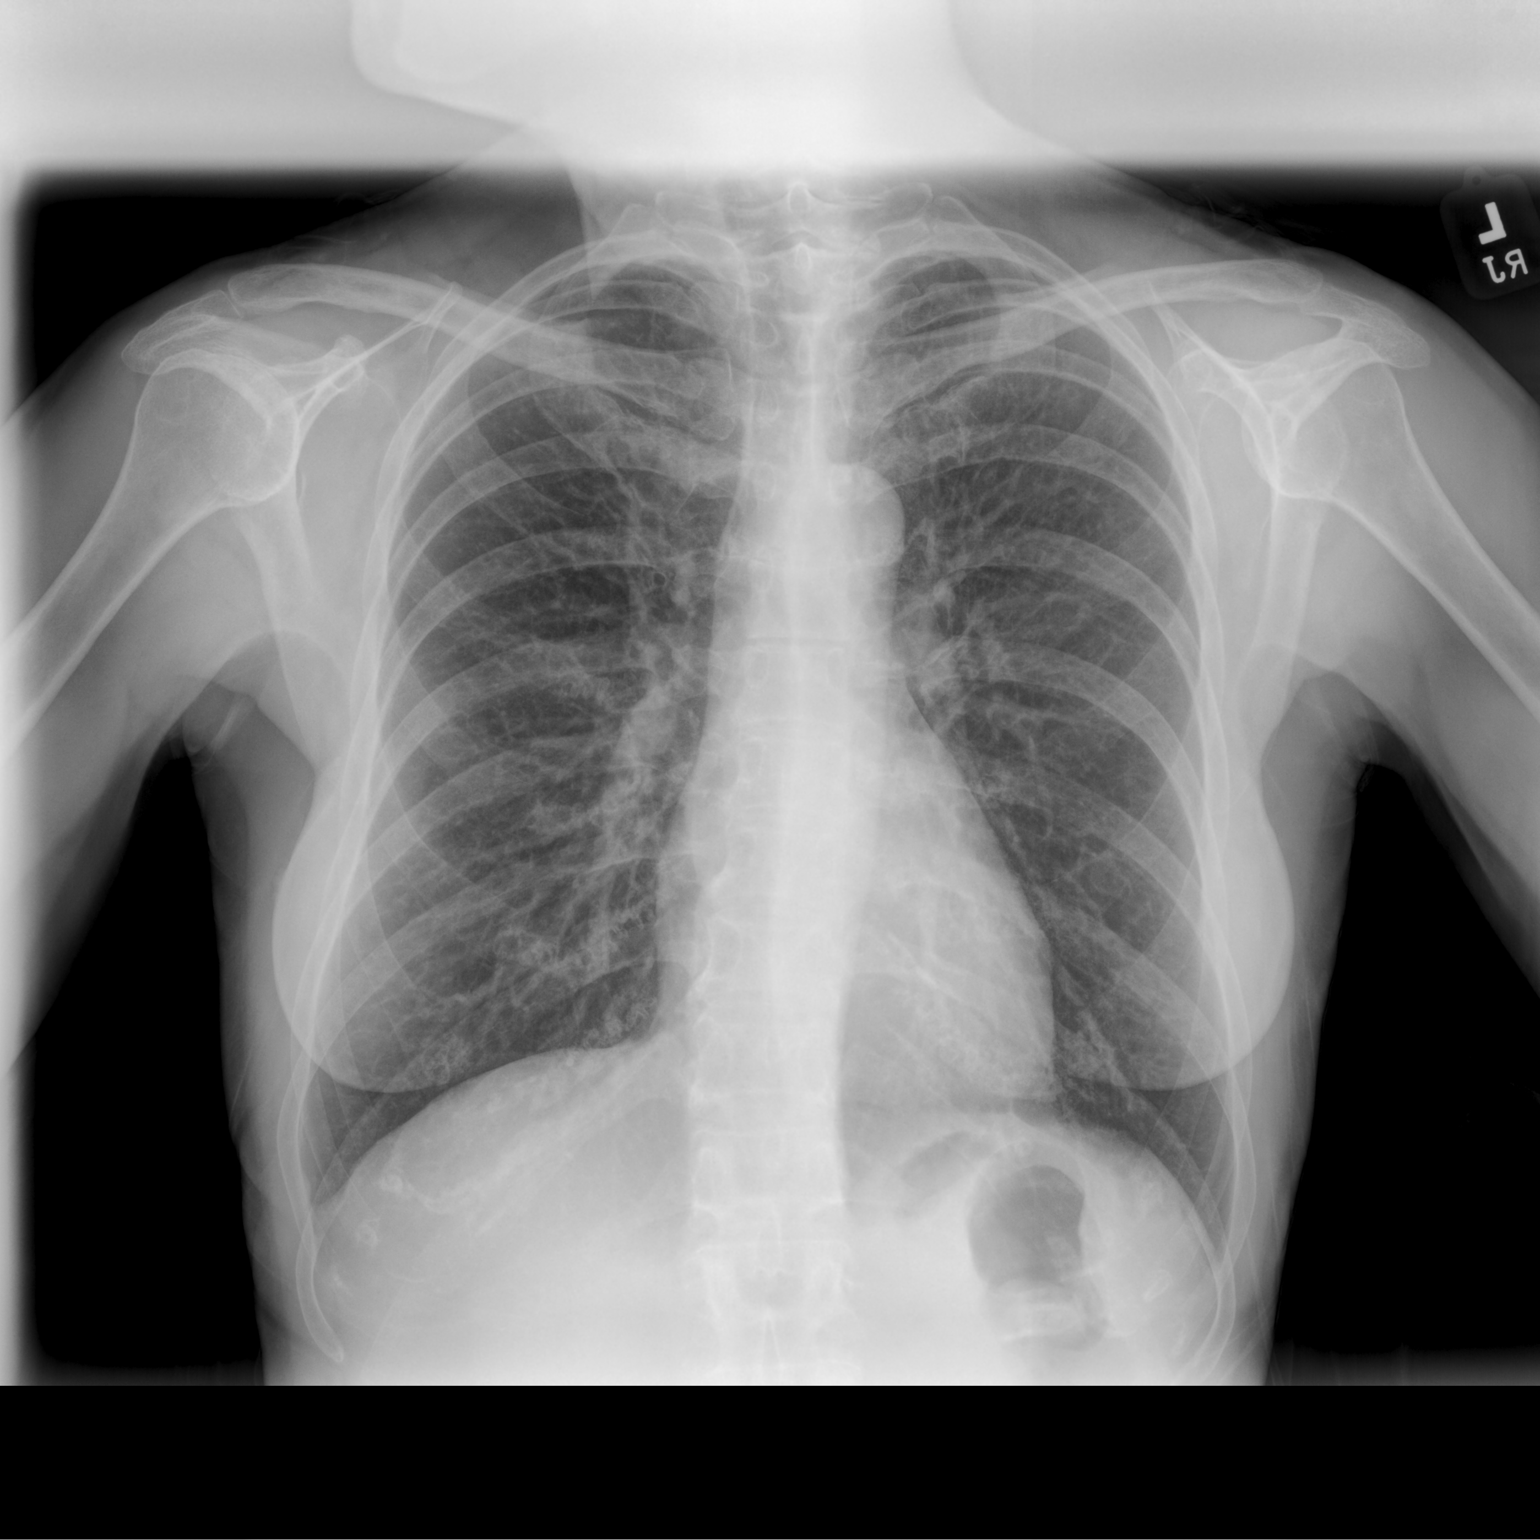

[chest lat]
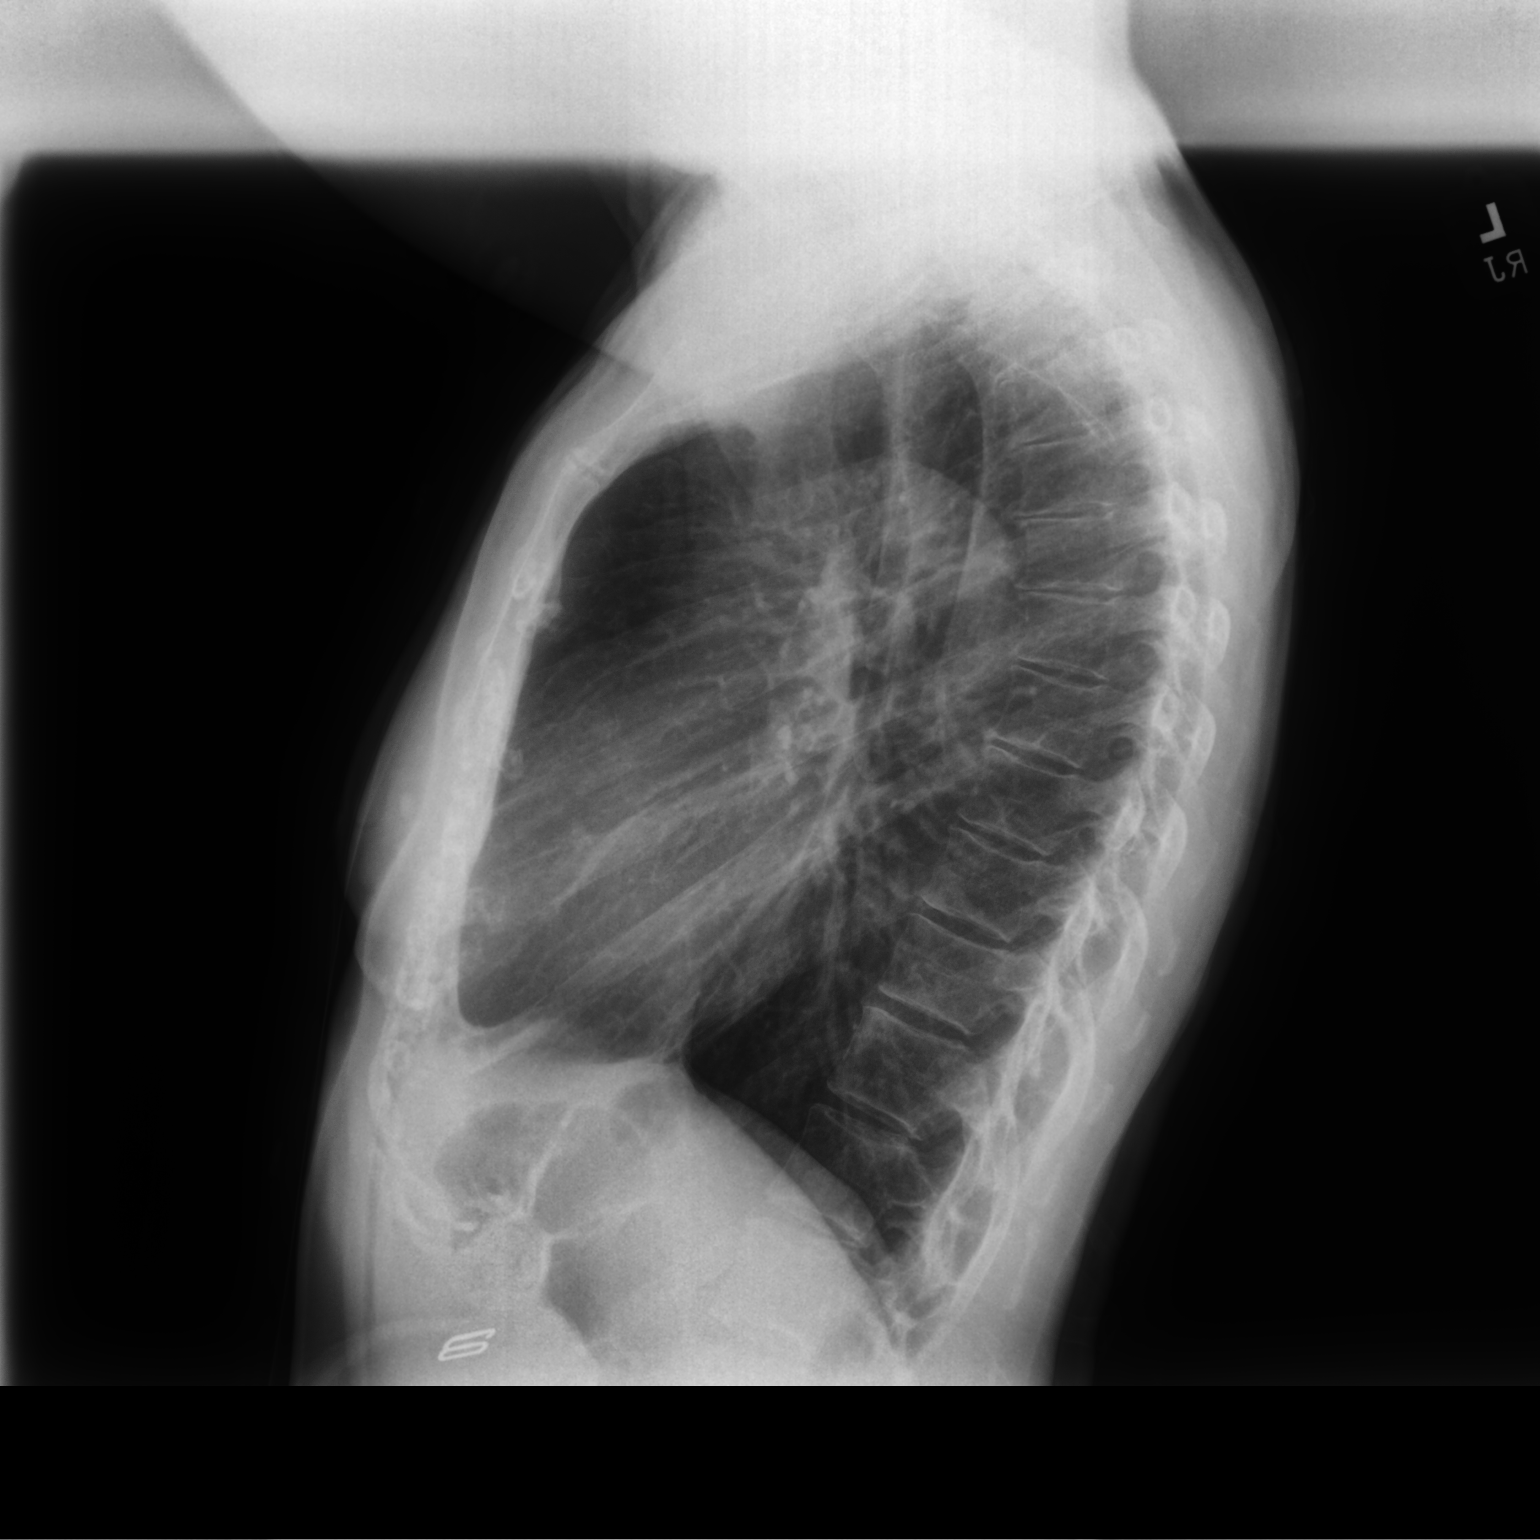

[2 of 2 positions shown; findings below may reference images not displayed]

FINDINGS: The heart size and mediastinal contours are within normal limits.
Both lungs are clear. The visualized skeletal structures are
unremarkable.
IMPRESSION: No active cardiopulmonary disease.

## 2022-06-16 ENCOUNTER — Ambulatory Visit: Payer: Medicare Other | Admitting: Pulmonary Disease

## 2022-06-19 ENCOUNTER — Ambulatory Visit (INDEPENDENT_AMBULATORY_CARE_PROVIDER_SITE_OTHER): Payer: Medicare Other | Admitting: Pulmonary Disease

## 2022-06-19 ENCOUNTER — Encounter: Payer: Self-pay | Admitting: Pulmonary Disease

## 2022-06-19 VITALS — BP 124/86 | HR 90 | Ht 61.0 in | Wt 116.0 lb

## 2022-06-19 DIAGNOSIS — R053 Chronic cough: Secondary | ICD-10-CM

## 2022-06-19 DIAGNOSIS — J45991 Cough variant asthma: Secondary | ICD-10-CM | POA: Diagnosis not present

## 2022-06-19 MED ORDER — FLUTICASONE FUROATE-VILANTEROL 100-25 MCG/ACT IN AEPB: 1.0000 | INHALATION_SPRAY | Freq: Every day | RESPIRATORY_TRACT | 3 refills | Status: DC

## 2022-06-19 NOTE — Patient Instructions (Addendum)
  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

## 2022-06-19 NOTE — Progress Notes (Signed)
6   Subjective:   PATIENT ID: Katrina Snow GENDER: female DOB: [birth year], MRN: [id number]   HPI  Chief Complaint  Patient presents with   Follow-up    Cough got better once off the inhaler    Reason for Visit: Follow-up  Ms. Katrina Snow is a 66 year old female never smoker with osteopenia, hypertension nonrheumatic mitral regurgitation depression who presents for follow-up.  Initial consult She reports longstanding cough for several years. Has tried pepcid a long time ago a month ago. Didn't work. Attempted diet changes to reduced acidic foods and caffeine. Denies reflux symptoms. Cough seems to occur with sitting for spells lasting 5 minutes. Does seem to occur with standing. Will have hacking cough intermittently during the day. Denies wheezing or shortness of breath. Denies nocturnal symptoms however notes she is on several pillows and usually has difficulty sleeping. She works 160 minutes a week.  Will have to sit at the back of church due to coughing. Worries about her symptoms when going to a show. Denies nasal congestion. Denies chronic respiratory infection.  03/13/22 Since our last visit her cough has improved with less frequency with minor spells related to throat irritation. Usually occurs in the morning. Still has some night cough 1-2 times a month.  06/19/22 Her cough resolved with Breo and omeprazole. However the cough has been creeping back slowly once stopping inhaler. Denies wheezing or shortness of breath. Did not feel like omeprazole x 1 month helped even with dietary changes.  Denies reflux symptoms.  Social History: Retired Electrical engineer   Past Medical History:  Diagnosis Date   Basal cell carcinoma    Fibroids    Mitral valve regurgitation    mild to moderate   Osteopenia      Family History  Problem Relation Age of Onset   Bladder Cancer Mother    Breast cancer Mother 77   Seizures Sister    Hypertension Sister    Anemia Neg Hx     Arrhythmia Neg Hx    Asthma Neg Hx    Clotting disorder Neg Hx    Fainting Neg Hx    Heart attack Neg Hx    Heart disease Neg Hx    Heart failure Neg Hx    Hyperlipidemia Neg Hx      Social History   Occupational History   Not on file  Tobacco Use   Smoking status: Never   Smokeless tobacco: Never  Substance and Sexual Activity   Alcohol use: Yes    Alcohol/week: 0.0 standard drinks of alcohol   Drug use: No   Sexual activity: Not on file    Allergies  Allergen Reactions   Hydrochlorothiazide      Outpatient Medications Prior to Visit  Medication Sig Dispense Refill   hydrochlorothiazide (HYDRODIURIL) 25 MG tablet TAKE 1 TABLET BY MOUTH EVERY DAY IN THE MORNING FOR 90 DAYS     omeprazole (PRILOSEC) 40 MG capsule Take 1 capsule (40 mg total) by mouth daily. (Patient not taking: Reported on 06/19/2022) 30 capsule 5   fluticasone furoate-vilanterol (BREO ELLIPTA) 100-25 MCG/ACT AEPB Inhale 1 puff into the lungs daily. (Patient not taking: Reported on 06/19/2022) 60 each 1   No facility-administered medications prior to visit.    Review of Systems  Constitutional:  Negative for chills, diaphoresis, fever, malaise/fatigue and weight loss.  HENT:  Negative for congestion.   Respiratory:  Positive for cough. Negative for hemoptysis, sputum production, shortness of breath and wheezing.   Cardiovascular:  Negative for chest pain, palpitations and leg swelling.     Objective:   Vitals:   06/19/22 1012  BP: 124/86  Pulse: 90  SpO2: 98%  Weight: 116 lb (52.6 kg)  Height: '5\' 1"'$  (1.549 m)    SpO2: 98 % O2 Device: None (Room air)  Physical Exam: General: Well-appearing, no acute distress HENT: [place], AT Eyes: EOMI, no scleral icterus Respiratory: Clear to auscultation bilaterally.  No crackles, wheezing or rales Cardiovascular: RRR, -M/R/G, no JVD Extremities:-Edema,-tenderness Neuro: AAO x4, CNII-XII grossly intact Psych: Normal mood, normal affect  Data  Reviewed:  Imaging: CXR 01/25/22 - Minimal bronchitic changes. Overall normal  PFT: 03/01/22 FVC 2.56 (89%) FEV1 2.1 (96%) Ratio 79  TLC 86% DLCO 108%. Partial bronchodilator response however not significant. This does not preclude benefit of inhalers if perceived. Interpretation: Normal PFTs   Labs: CBC No results found for: "WBC", "RBC", "HGB", "HCT", "PLT", "MCV", "MCH", "MCHC", "RDW", "LYMPHSABS", "MONOABS", "EOSABS", "BASOSABS"  Health Maintenance: Immunization History  Administered Date(s) Administered   Influenza Split 08/12/2020, 08/11/2021   Influenza,inj,Quad PF,6+ Mos 07/31/2017, 09/03/2018, 08/12/2019   PFIZER(Purple Top)SARS-COV-2 Vaccination 01/05/2020, 01/26/2020, 07/27/2020, 01/29/2021   PNEUMOCOCCAL CONJUGATE-20 01/02/2022   Pfizer Covid-19 Vaccine Bivalent Booster 23yr & up 07/26/2021, 03/06/2022   Tdap 01/03/2010, 12/25/2019   Zoster, Live 01/23/2017, 07/16/2017   Assessment & Plan:   Discussion: 66year old female never smoker with osteopenia, HTN, nonrheumatic mitral regurgitation, depression who presents for follow-up. Cough recurred off inhalers and PPI. Will retrial bronchodilators first since PFTs had partial BD effect and if cough persistent may need to restart PPI.  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 Worsened off inhalers Chronic cough --RESTART Breo 100-25 ONE puff ONCE a day. Can reduce to as needed if symptoms improve  No orders of the defined types were placed in this encounter.  Meds ordered this encounter  Medications   fluticasone furoate-vilanterol (BREO ELLIPTA) 100-25 MCG/ACT AEPB    Sig: Inhale 1 puff into the lungs daily.    Dispense:  60 each    Refill:  3    Return in about 5 months (around 11/19/2022).  I have spent a total time of 31-minutes on the day of the appointment including chart review, data review, collecting history, coordinating care and discussing medical diagnosis and plan with the  patient/family. Past medical history, allergies, medications were reviewed. Pertinent imaging, labs and tests included in this note have been reviewed and interpreted independently by me.  CHamilton MD [facility] 06/19/2022 10:38 AM  Office Number [phone]

## 2022-06-20 ENCOUNTER — Ambulatory Visit
Admission: RE | Admit: 2022-06-20 | Discharge: 2022-06-20 | Disposition: A | Discharge status: Home / Self Care | Payer: Medicare Other | Source: Ambulatory Visit | Attending: Family Medicine | Admitting: Family Medicine

## 2022-06-20 DIAGNOSIS — Z1231 Encounter for screening mammogram for malignant neoplasm of breast: Secondary | ICD-10-CM

## 2022-06-20 DIAGNOSIS — M85852 Other specified disorders of bone density and structure, left thigh: Secondary | ICD-10-CM

## 2022-11-23 ENCOUNTER — Encounter (HOSPITAL_BASED_OUTPATIENT_CLINIC_OR_DEPARTMENT_OTHER): Payer: Self-pay | Admitting: Pulmonary Disease

## 2022-12-11 ENCOUNTER — Encounter (HOSPITAL_BASED_OUTPATIENT_CLINIC_OR_DEPARTMENT_OTHER): Payer: Self-pay | Admitting: Pulmonary Disease

## 2022-12-11 ENCOUNTER — Ambulatory Visit (INDEPENDENT_AMBULATORY_CARE_PROVIDER_SITE_OTHER): Payer: Medicare Other | Admitting: Pulmonary Disease

## 2022-12-11 VITALS — BP 120/70 | HR 68 | Ht 61.0 in | Wt 116.4 lb

## 2022-12-11 DIAGNOSIS — R053 Chronic cough: Secondary | ICD-10-CM

## 2022-12-11 DIAGNOSIS — J45991 Cough variant asthma: Secondary | ICD-10-CM | POA: Diagnosis not present

## 2022-12-11 MED ORDER — FLUTICASONE FUROATE-VILANTEROL 100-25 MCG/ACT IN AEPB: 1.0000 | INHALATION_SPRAY | Freq: Every day | RESPIRATORY_TRACT | 3 refills | Status: DC

## 2022-12-11 NOTE — Patient Instructions (Addendum)
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 Improved on intermittent low-dose ICS/LABA Chronic cough - improving --Continue to reduce Breo 100-25 ONE puff ONCE a day every 3 three days. Ok to use as needed daily  Follow up with me in 6 months

## 2022-12-11 NOTE — Progress Notes (Unsigned)
6   Subjective:   PATIENT ID: Katrina Snow GENDER: female DOB: [birth year], MRN: [id number]   HPI  Chief Complaint  Patient presents with   Follow-up    Inhaler is working coughing less    Reason for Visit: Follow-up  Ms. Katrina Snow is a 67 year old female never smoker with osteopenia, hypertension nonrheumatic mitral regurgitation depression who presents for follow-up.  Initial consult She reports longstanding cough for several years. Has tried pepcid a long time ago a month ago. Didn't work. Attempted diet changes to reduced acidic foods and caffeine. Denies reflux symptoms. Cough seems to occur with sitting for spells lasting 5 minutes. Does seem to occur with standing. Will have hacking cough intermittently during the day. Denies wheezing or shortness of breath. Denies nocturnal symptoms however notes she is on several pillows and usually has difficulty sleeping. She works 160 minutes a week.  Will have to sit at the back of church due to coughing. Worries about her symptoms when going to a show. Denies nasal congestion. Denies chronic respiratory infection.  03/13/22 Since our last visit her cough has improved with less frequency with minor spells related to throat irritation. Usually occurs in the morning. Still has some night cough 1-2 times a month.  06/19/22 Her cough resolved with Breo and omeprazole. However the cough has been creeping back slowly once stopping inhaler. Denies wheezing or shortness of breath. Did not feel like omeprazole x 1 month helped even with dietary changes.  Denies reflux symptoms.  12/11/22 She is using Breo every other day. Reports cough has improved. Has reintroduced acidic foods without worsening symptoms. Denies wheezing or shortness of breath.  Social History: Retired Electrical engineer   Past Medical History:  Diagnosis Date   Basal cell carcinoma    Fibroids    Mitral valve regurgitation    mild to moderate   Osteopenia       Family History  Problem Relation Age of Onset   Bladder Cancer Mother    Breast cancer Mother 46   Seizures Sister    Hypertension Sister    Anemia Neg Hx    Arrhythmia Neg Hx    Asthma Neg Hx    Clotting disorder Neg Hx    Fainting Neg Hx    Heart attack Neg Hx    Heart disease Neg Hx    Heart failure Neg Hx    Hyperlipidemia Neg Hx      Social History   Occupational History   Not on file  Tobacco Use   Smoking status: Never   Smokeless tobacco: Never  Substance and Sexual Activity   Alcohol use: Yes    Alcohol/week: 0.0 standard drinks of alcohol   Drug use: No   Sexual activity: Not on file    Allergies  Allergen Reactions   Hydrochlorothiazide      Outpatient Medications Prior to Visit  Medication Sig Dispense Refill   Alendronate Sodium 70 MG TBEF      hydrochlorothiazide (HYDRODIURIL) 25 MG tablet TAKE 1 TABLET BY MOUTH EVERY DAY IN THE MORNING FOR 90 DAYS     fluticasone furoate-vilanterol (BREO ELLIPTA) 100-25 MCG/ACT AEPB Inhale 1 puff into the lungs daily. 60 each 3   omeprazole (PRILOSEC) 40 MG capsule Take 1 capsule (40 mg total) by mouth daily. 30 capsule 5   No facility-administered medications prior to visit.    Review of Systems  Constitutional:  Negative for chills, diaphoresis, fever, malaise/fatigue and weight loss.  HENT:  Negative  for congestion.   Respiratory:  Positive for cough. Negative for hemoptysis, sputum production, shortness of breath and wheezing.   Cardiovascular:  Negative for chest pain, palpitations and leg swelling.     Objective:   Vitals:   12/11/22 1313  BP: 120/70  Pulse: 68  SpO2: 99%  Weight: 116 lb 6.4 oz (52.8 kg)  Height: 5' 1"$  (1.549 m)    SpO2: 99 % O2 Device: None (Room air)  Physical Exam: General: Well-appearing, no acute distress HENT: [place], AT Eyes: EOMI, no scleral icterus Respiratory: Clear to auscultation bilaterally.  No crackles, wheezing or rales Cardiovascular: RRR, -M/R/G, no  JVD Extremities:-Edema,-tenderness Neuro: AAO x4, CNII-XII grossly intact Psych: Normal mood, normal affect  Data Reviewed:  Imaging: CXR 01/25/22 - Minimal bronchitic changes. Overall normal  PFT: 03/01/22 FVC 2.56 (89%) FEV1 2.1 (96%) Ratio 79  TLC 86% DLCO 108%. Partial bronchodilator response however not significant. This does not preclude benefit of inhalers if perceived. Interpretation: Normal PFTs   Labs: CBC No results found for: "WBC", "RBC", "HGB", "HCT", "PLT", "MCV", "MCH", "MCHC", "RDW", "LYMPHSABS", "MONOABS", "EOSABS", "BASOSABS"  Health Maintenance: Immunization History  Administered Date(s) Administered   Influenza Split 08/12/2020, 08/11/2021   Influenza, High Dose Seasonal PF 07/24/2022   Influenza,inj,Quad PF,6+ Mos 07/31/2017, 09/03/2018, 08/12/2019   PFIZER Comirnaty(Gray Top)Covid-19 Tri-Sucrose Vaccine 07/15/2022   PFIZER(Purple Top)SARS-COV-2 Vaccination 01/05/2020, 01/26/2020, 07/27/2020, 01/29/2021   PNEUMOCOCCAL CONJUGATE-20 01/02/2022   Pfizer Covid-19 Vaccine Bivalent Booster 45yr & up 07/26/2021, 03/06/2022   Tdap 01/03/2010, 12/25/2019   Zoster, Live 01/23/2017, 07/16/2017   Assessment & Plan:   Discussion: 67year old female never smoker with osteopenia, HTN, nonrheumatic mitral regurgitation, depression who presents for follow-up. Cough recurred off inhalers and PPI. Will retrial bronchodilators first since PFTs had partial BD effect and if cough persistent may need to restart PPI.  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 Improved on intermittent low-dose ICS/LABA Chronic cough --Continue to reduce Breo 100-25 ONE puff ONCE a day every 3 three days. Ok to use as needed daily  No orders of the defined types were placed in this encounter.  Meds ordered this encounter  Medications   fluticasone furoate-vilanterol (BREO ELLIPTA) 100-25 MCG/ACT AEPB    Sig: Inhale 1 puff into the lungs  daily.    Dispense:  60 each    Refill:  3    No follow-ups on file.  I have spent a total time of***-minutes on the day of the appointment including chart review, data review, collecting history, coordinating care and discussing medical diagnosis and plan with the patient/family. Past medical history, allergies, medications were reviewed. Pertinent imaging, labs and tests included in this note have been reviewed and interpreted independently by me.  CIsanti MD [facility] 12/11/2022 1:22 PM  Office Number [phone]

## 2022-12-13 ENCOUNTER — Encounter (HOSPITAL_BASED_OUTPATIENT_CLINIC_OR_DEPARTMENT_OTHER): Payer: Self-pay | Admitting: Pulmonary Disease

## 2023-01-17 ENCOUNTER — Other Ambulatory Visit (HOSPITAL_BASED_OUTPATIENT_CLINIC_OR_DEPARTMENT_OTHER): Payer: Self-pay | Admitting: Family Medicine

## 2023-01-17 ENCOUNTER — Other Ambulatory Visit: Payer: Self-pay | Admitting: Family Medicine

## 2023-01-17 DIAGNOSIS — I10 Essential (primary) hypertension: Secondary | ICD-10-CM

## 2023-01-17 DIAGNOSIS — Z1231 Encounter for screening mammogram for malignant neoplasm of breast: Secondary | ICD-10-CM

## 2023-02-21 ENCOUNTER — Ambulatory Visit (HOSPITAL_BASED_OUTPATIENT_CLINIC_OR_DEPARTMENT_OTHER)
Admission: RE | Admit: 2023-02-21 | Discharge: 2023-02-21 | Disposition: A | Discharge status: Home / Self Care | Payer: Medicare Other | Source: Ambulatory Visit | Attending: Family Medicine | Admitting: Family Medicine

## 2023-02-21 DIAGNOSIS — I10 Essential (primary) hypertension: Secondary | ICD-10-CM | POA: Insufficient documentation

## 2023-02-28 ENCOUNTER — Other Ambulatory Visit (HOSPITAL_BASED_OUTPATIENT_CLINIC_OR_DEPARTMENT_OTHER): Payer: Self-pay | Admitting: Family Medicine

## 2023-02-28 DIAGNOSIS — R160 Hepatomegaly, not elsewhere classified: Secondary | ICD-10-CM

## 2023-03-06 ENCOUNTER — Ambulatory Visit (HOSPITAL_COMMUNITY)
Admission: RE | Admit: 2023-03-06 | Discharge: 2023-03-06 | Disposition: A | Discharge status: Home / Self Care | Payer: Medicare Other | Source: Ambulatory Visit | Attending: Family Medicine | Admitting: Family Medicine

## 2023-03-06 DIAGNOSIS — R160 Hepatomegaly, not elsewhere classified: Secondary | ICD-10-CM | POA: Diagnosis present

## 2023-03-06 MED ORDER — GADOBUTROL 1 MMOL/ML IV SOLN
5.0000 mL | Freq: Once | INTRAVENOUS | Status: AC | PRN
  Administered 2023-03-06: 5 mL via INTRAVENOUS

## 2023-06-21 ENCOUNTER — Ambulatory Visit (HOSPITAL_BASED_OUTPATIENT_CLINIC_OR_DEPARTMENT_OTHER): Payer: Medicare Other | Admitting: Pulmonary Disease

## 2023-06-21 ENCOUNTER — Encounter (HOSPITAL_BASED_OUTPATIENT_CLINIC_OR_DEPARTMENT_OTHER): Payer: Self-pay | Admitting: Pulmonary Disease

## 2023-06-21 ENCOUNTER — Ambulatory Visit (INDEPENDENT_AMBULATORY_CARE_PROVIDER_SITE_OTHER): Payer: Medicare Other | Admitting: Pulmonary Disease

## 2023-06-21 VITALS — BP 128/70 | HR 76 | Resp 16 | Ht 61.0 in | Wt 118.2 lb

## 2023-06-21 DIAGNOSIS — R918 Other nonspecific abnormal finding of lung field: Secondary | ICD-10-CM | POA: Diagnosis not present

## 2023-06-21 DIAGNOSIS — R911 Solitary pulmonary nodule: Secondary | ICD-10-CM | POA: Insufficient documentation

## 2023-06-21 DIAGNOSIS — J45991 Cough variant asthma: Secondary | ICD-10-CM | POA: Diagnosis not present

## 2023-06-21 MED ORDER — FLUTICASONE FUROATE-VILANTEROL 100-25 MCG/ACT IN AEPB: 1.0000 | INHALATION_SPRAY | Freq: Every day | RESPIRATORY_TRACT | 6 refills | Status: DC

## 2023-06-21 NOTE — Patient Instructions (Signed)
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 Improved on intermittent low-dose ICS/LABA Chronic cough -  --Continue Breo 100-25 ONE puff ONCE a day  Subcentimeter lung nodules, largest 5 mm in RML --ORDER CT Chest without contrast in 1 year

## 2023-06-21 NOTE — Progress Notes (Signed)
6   Subjective:   PATIENT ID: Katrina Snow GENDER: female DOB: [birth year], MRN: [id number]   HPI  Chief Complaint  Patient presents with   Follow-up    About the same, no worse no better. Has CT calcium test with her and wants you to review the lung portion-small nodules.    Reason for Visit: Follow-up  Ms. Katrina Snow is a 67 year old female never smoker with osteopenia, hypertension nonrheumatic mitral regurgitation depression who presents for follow-up.  Initial consult She reports longstanding cough for several years. Has tried pepcid a long time ago a month ago. Didn't work. Attempted diet changes to reduced acidic foods and caffeine. Denies reflux symptoms. Cough seems to occur with sitting for spells lasting 5 minutes. Does seem to occur with standing. Will have hacking cough intermittently during the day. Denies wheezing or shortness of breath. Denies nocturnal symptoms however notes she is on several pillows and usually has difficulty sleeping. She works 160 minutes a week.  Will have to sit at the back of church due to coughing. Worries about her symptoms when going to a show. Denies nasal congestion. Denies chronic respiratory infection.  03/13/22 Since our last visit her cough has improved with less frequency with minor spells related to throat irritation. Usually occurs in the morning. Still has some night cough 1-2 times a month.  06/19/22 Her cough resolved with Breo and omeprazole. However the cough has been creeping back slowly once stopping inhaler. Denies wheezing or shortness of breath. Did not feel like omeprazole x 1 month helped even with dietary changes.  Denies reflux symptoms.  12/11/22 She is using Breo every other day. Reports cough has improved. Has reintroduced acidic foods without worsening symptoms. Denies wheezing or shortness of breath.  06/21/23 Since our last visit she is still taking Breo every other day. Unable to to titrate off like we  discussed due to worsening cough. Does have productive sputum and cough with deep breaths.  Social History: Retired Doctor, general practice   Past Medical History:  Diagnosis Date   Basal cell carcinoma    Fibroids    Mitral valve regurgitation    mild to moderate   Osteopenia      Family History  Problem Relation Age of Onset   Bladder Cancer Mother    Breast cancer Mother 49   Seizures Sister    Hypertension Sister    Anemia Neg Hx    Arrhythmia Neg Hx    Asthma Neg Hx    Clotting disorder Neg Hx    Fainting Neg Hx    Heart attack Neg Hx    Heart disease Neg Hx    Heart failure Neg Hx    Hyperlipidemia Neg Hx      Social History   Occupational History   Not on file  Tobacco Use   Smoking status: Never   Smokeless tobacco: Never  Substance and Sexual Activity   Alcohol use: Yes    Alcohol/week: 0.0 standard drinks of alcohol   Drug use: No   Sexual activity: Not on file    Allergies  Allergen Reactions   Hydrochlorothiazide      Outpatient Medications Prior to Visit  Medication Sig Dispense Refill   Alendronate Sodium 70 MG TBEF      Calcium Carb-Cholecalciferol (CALCIUM PLUS VITAMIN D3) 600-20 MG-MCG TABS      hydrochlorothiazide (HYDRODIURIL) 25 MG tablet TAKE 1 TABLET BY MOUTH EVERY DAY IN THE MORNING FOR 90 DAYS  Magnesium 200 MG TABS      fluticasone furoate-vilanterol (BREO ELLIPTA) 100-25 MCG/ACT AEPB Inhale 1 puff into the lungs daily. 60 each 3   No facility-administered medications prior to visit.    Review of Systems  Constitutional:  Negative for chills, diaphoresis, fever, malaise/fatigue and weight loss.  HENT:  Negative for congestion.   Respiratory:  Positive for cough and sputum production. Negative for hemoptysis, shortness of breath and wheezing.   Cardiovascular:  Negative for chest pain, palpitations and leg swelling.     Objective:   Vitals:   06/21/23 0828  BP: 128/70  Pulse: 76  Resp: 16  SpO2: 99%  Weight: 118 lb  3.2 oz (53.6 kg)  Height: 5\' 1"  (1.549 m)    SpO2: 99 %  Physical Exam: General: Well-appearing, no acute distress HENT: [place], AT Eyes: EOMI, no scleral icterus Respiratory: Clear to auscultation bilaterally.  No crackles, wheezing or rales Cardiovascular: RRR, -M/R/G, no JVD Extremities:-Edema,-tenderness Neuro: AAO x4, CNII-XII grossly intact Psych: Normal mood, normal affect  Data Reviewed:  Imaging: CXR 01/25/22 - Minimal bronchitic changes. Overall normal CT Cardiac 02/21/23 - 7x3 mm (5mm mean diameter) RML nodule. Subcentimeter nodules bilaterally  PFT: 03/01/22 FVC 2.56 (89%) FEV1 2.1 (96%) Ratio 79  TLC 86% DLCO 108%. Partial bronchodilator response however not significant. This does not preclude benefit of inhalers if perceived. Interpretation: Normal PFTs   Labs: CBC No results found for: "WBC", "RBC", "HGB", "HCT", "PLT", "MCV", "MCH", "MCHC", "RDW", "LYMPHSABS", "MONOABS", "EOSABS", "BASOSABS"  Health Maintenance: Immunization History  Administered Date(s) Administered   Influenza Split 08/12/2020, 08/11/2021   Influenza, High Dose Seasonal PF 07/24/2022   Influenza,inj,Quad PF,6+ Mos 07/31/2017, 09/03/2018, 08/12/2019   PFIZER Comirnaty(Gray Top)Covid-19 Tri-Sucrose Vaccine 07/15/2022   PFIZER(Purple Top)SARS-COV-2 Vaccination 01/05/2020, 01/26/2020, 07/27/2020, 01/29/2021   PNEUMOCOCCAL CONJUGATE-20 01/02/2022   Pfizer Covid-19 Vaccine Bivalent Booster 43yrs & up 07/26/2021, 03/06/2022   Tdap 01/03/2010, 12/25/2019   Zoster, Live 01/23/2017, 07/16/2017   Assessment & Plan:   Discussion: 67 year old female never smoker with asthma, subcentimeter pulmonary nodules, HTN nonrheumatic mitral regurgitation, depression who presents for follow-up. Reviewed CT scan with low probability of malignancy (2.7% on [facility] solitary nodule) however reasonable for repeat scan in one year for multiple nodules for surveillance. Continues to have intermittent productive cough.  Encouraged daily ICS/LABA use for symptoms control. Discussed clinical course and management of asthma including progression of asthma including bronchodilator regimen, preventive care including vaccinations and action plan for exacerbation.  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 Not controlled symptoms Chronic cough - persistent --RESTART daily use of Breo 100-25 ONE puff ONCE a day  Subcentimeter lung nodules, largest 5 mm in RML Multiple lung nodules --ORDER CT Chest without contrast  Orders Placed This Encounter  Procedures   CT Chest Wo Contrast    Standing Status:   Future    Standing Expiration Date:   06/20/2024    Scheduling Instructions:     Schedule in August 2025. Please ensure Pulmonary follow-up with me afterwards    Order Specific Question:   Preferred imaging location?    Answer:   [facility]   Meds ordered this encounter  Medications   fluticasone furoate-vilanterol (BREO ELLIPTA) 100-25 MCG/ACT AEPB    Sig: Inhale 1 puff into the lungs daily.    Dispense:  60 each    Refill:  6    Return in about 1 year (around 06/20/2024) for after CT scan.  I have spent a  total time of 30-minutes on the day of the appointment including chart review, data review, collecting history, coordinating care and discussing medical diagnosis and plan with the patient/family. Past medical history, allergies, medications were reviewed. Pertinent imaging, labs and tests included in this note have been reviewed and interpreted independently by me.  Suren Payne Mechele Collin, MD [facility] 06/21/2023 8:56 AM  Office Number [phone]

## 2023-06-22 ENCOUNTER — Ambulatory Visit
Admission: RE | Admit: 2023-06-22 | Discharge: 2023-06-22 | Disposition: A | Discharge status: Home / Self Care | Payer: Medicare Other | Source: Ambulatory Visit | Attending: Family Medicine | Admitting: Family Medicine

## 2023-06-22 DIAGNOSIS — Z1231 Encounter for screening mammogram for malignant neoplasm of breast: Secondary | ICD-10-CM

## 2024-01-03 ENCOUNTER — Other Ambulatory Visit: Payer: Self-pay

## 2024-01-03 MED ORDER — FLUTICASONE FUROATE-VILANTEROL 100-25 MCG/ACT IN AEPB: 1.0000 | INHALATION_SPRAY | Freq: Every day | RESPIRATORY_TRACT | 6 refills | Status: DC

## 2024-01-18 ENCOUNTER — Other Ambulatory Visit: Payer: Self-pay | Admitting: Family Medicine

## 2024-01-18 DIAGNOSIS — Z0000 Encounter for general adult medical examination without abnormal findings: Secondary | ICD-10-CM

## 2024-01-18 DIAGNOSIS — M810 Age-related osteoporosis without current pathological fracture: Secondary | ICD-10-CM

## 2024-04-21 ENCOUNTER — Telehealth (HOSPITAL_BASED_OUTPATIENT_CLINIC_OR_DEPARTMENT_OTHER): Payer: Self-pay | Admitting: Pulmonary Disease

## 2024-04-21 NOTE — Telephone Encounter (Signed)
 Patient has scheduled her return visit for 9/29 with Dr Kassie. Per JE, patient will need a CT prior to being seen. Please call patient to schedule. She is requesting to try both her cell and her home number.

## 2024-04-22 NOTE — Telephone Encounter (Signed)
Patient scheduled.  Nothing further needed.

## 2024-06-06 ENCOUNTER — Other Ambulatory Visit (HOSPITAL_BASED_OUTPATIENT_CLINIC_OR_DEPARTMENT_OTHER)

## 2024-06-16 ENCOUNTER — Ambulatory Visit (HOSPITAL_BASED_OUTPATIENT_CLINIC_OR_DEPARTMENT_OTHER)
Admission: RE | Admit: 2024-06-16 | Discharge: 2024-06-16 | Disposition: A | Discharge status: Home / Self Care | Source: Ambulatory Visit | Attending: Pulmonary Disease | Admitting: Pulmonary Disease

## 2024-06-16 DIAGNOSIS — J45991 Cough variant asthma: Secondary | ICD-10-CM | POA: Diagnosis present

## 2024-06-16 DIAGNOSIS — R918 Other nonspecific abnormal finding of lung field: Secondary | ICD-10-CM | POA: Insufficient documentation

## 2024-06-24 ENCOUNTER — Ambulatory Visit
Admission: RE | Admit: 2024-06-24 | Discharge: 2024-06-24 | Disposition: A | Discharge status: Home / Self Care | Source: Ambulatory Visit | Attending: Family Medicine | Admitting: Family Medicine

## 2024-06-24 DIAGNOSIS — Z0000 Encounter for general adult medical examination without abnormal findings: Secondary | ICD-10-CM

## 2024-07-01 ENCOUNTER — Ambulatory Visit: Payer: Self-pay | Admitting: Pulmonary Disease

## 2024-07-21 ENCOUNTER — Encounter (HOSPITAL_BASED_OUTPATIENT_CLINIC_OR_DEPARTMENT_OTHER): Payer: Self-pay | Admitting: Pulmonary Disease

## 2024-07-21 ENCOUNTER — Ambulatory Visit (HOSPITAL_BASED_OUTPATIENT_CLINIC_OR_DEPARTMENT_OTHER): Admitting: Pulmonary Disease

## 2024-07-21 VITALS — BP 130/71 | HR 71 | Ht 61.0 in | Wt 121.3 lb

## 2024-07-21 DIAGNOSIS — R053 Chronic cough: Secondary | ICD-10-CM

## 2024-07-21 DIAGNOSIS — R918 Other nonspecific abnormal finding of lung field: Secondary | ICD-10-CM

## 2024-07-21 MED ORDER — FLUTICASONE FUROATE-VILANTEROL 100-25 MCG/ACT IN AEPB: 1.0000 | INHALATION_SPRAY | Freq: Every day | RESPIRATORY_TRACT | 11 refills | Status: AC

## 2024-07-21 NOTE — Patient Instructions (Signed)
 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 Improved on intermittent low-dose ICS/LABA Chronic cough -  improving --Continue to reduce Breo 100-25 ONE puff ONCE a day every 3 three days. Ok to use as needed daily --Self-discontinued PPI due to ineffectiveness  Multiple lung nodules --Reviewed imaging with largest nodule 7mm ans stable 15 months and new 4 mm nodule in right lower lobe --Likely benign with subcentimeter nodules with an inflammatory or infectious etiology in setting of reflux --ORDERED CT chest without contrast in 1 year (Aug 2026)

## 2024-07-21 NOTE — Progress Notes (Signed)
 Subjective:   PATIENT ID: Katrina Snow GENDER: female DOB: [birth year], MRN: [id number]  Chief Complaint  Patient presents with   Cough    Reason for Visit: Follow-up   Ms. Hiya Point is a 68 y.o. female  never smoker with osteopenia, HTN, nonrheumatic mitral regurgitation and depression who presents for follow-up for multiple lung nodules and cough.     Initial consult She reports longstanding cough for several years. Has tried pepcid a long time ago a month ago. Didn't work. Attempted diet changes to reduced acidic foods and caffeine. Denies reflux symptoms. Cough seems to occur with sitting for spells lasting 5 minutes. Does seem to occur with standing. Will have hacking cough intermittently during the day. Denies wheezing or shortness of breath. Denies nocturnal symptoms however notes she is on several pillows and usually has difficulty sleeping. She works 160 minutes a week.  Will have to sit at the back of church due to coughing. Worries about her symptoms when going to a show. Denies nasal congestion. Denies chronic respiratory infection.  2023- Cough improved/resolved with Breo and omeprazole. Cough returned after stopping inhaler. 2024 - Using Breo every other day with improved cough. Worsening cough with acidic foods  Social History: Retired Doctor, general practice    07/21/2024 Discussed the use of AI scribe software for clinical note transcription with the patient, who gave verbal consent to proceed.  History of Present Illness She has a history of cough variant asthma and has been using Breo at a strength of 100 micrograms, one puff daily. Initially, she took it every other day but increased to daily use after experiencing persistent coughing. This adjustment has significantly improved her symptoms, reducing the frequency and severity of coughing spells. The cough is now manageable and does not disrupt her sleep or daily activities. The cough starts dry and  becomes productive with mucus. No recent significant illnesses requiring antibiotics or steroids, and no shortness of breath or wheezing.  She is here for follow-up on pulmonary nodules previously identified on a CT scan. The largest nodule, located in the right middle lobe, measures 7 millimeters and has been stable for 15 months. A new 4 millimeter nodule has been noted. No symptoms such as fever or chills that might suggest an active infection. She does not smoke. The nodules are being monitored with periodic CT scans.     Past Medical History:  Diagnosis Date   Basal cell carcinoma    Fibroids    Mitral valve regurgitation    mild to moderate   Osteopenia      Family History  Problem Relation Age of Onset   Bladder Cancer Mother    Breast cancer Mother 33   Seizures Sister    Hypertension Sister    Anemia Neg Hx    Arrhythmia Neg Hx    Asthma Neg Hx    Clotting disorder Neg Hx    Fainting Neg Hx    Heart attack Neg Hx    Heart disease Neg Hx    Heart failure Neg Hx    Hyperlipidemia Neg Hx      Social History   Occupational History   Not on file  Tobacco Use   Smoking status: Never   Smokeless tobacco: Never  Substance and Sexual Activity   Alcohol use: Yes    Alcohol/week: 0.0 standard drinks of alcohol   Drug use: No   Sexual activity: Not on file    No Known Allergies  Outpatient Medications Prior to Visit  Medication Sig Dispense Refill   Alendronate Sodium 70 MG TBEF      Calcium Carb-Cholecalciferol (CALCIUM PLUS VITAMIN D3) 600-20 MG-MCG TABS      Magnesium 200 MG TABS  (Patient taking differently: Take 400 mg by mouth daily.)     olmesartan-hydrochlorothiazide (BENICAR HCT) 40-25 MG tablet Take 1 tablet by mouth daily.     fluticasone  furoate-vilanterol (BREO ELLIPTA ) 100-25 MCG/ACT AEPB Inhale 1 puff into the lungs daily. 60 each 6   hydrochlorothiazide (HYDRODIURIL) 25 MG tablet TAKE 1 TABLET BY MOUTH EVERY DAY IN THE MORNING FOR 90 DAYS  (Patient not taking: Reported on 07/21/2024)     No facility-administered medications prior to visit.    Review of Systems  Constitutional:  Negative for chills, diaphoresis, fever, malaise/fatigue and weight loss.  HENT:  Negative for congestion.   Respiratory:  Positive for cough. Negative for hemoptysis, sputum production, shortness of breath and wheezing.   Cardiovascular:  Negative for chest pain, palpitations and leg swelling.     Objective:   Vitals:   07/21/24 0818  BP: 130/71  Pulse: 71  SpO2: 98%  Weight: 121 lb 4.8 oz (55 kg)  Height: 5' 1 (1.549 m)   SpO2: 98 %  Physical Exam: General: Well-appearing, no acute distress HENT: [place], AT Eyes: EOMI, no scleral icterus Respiratory: Clear to auscultation bilaterally.  No crackles, wheezing or rales Cardiovascular: RRR, -M/R/G, no JVD Extremities:-Edema,-tenderness Neuro: AAO x4, CNII-XII grossly intact Psych: Normal mood, normal affect  Data Reviewed:  Imaging: CT Cardiac 02/21/23 - Multiple pulmonary nodules with largest 7 x5 mm in RML in visualized lung fields CT Chest 06/16/24 - Stable pulmonary nodules with largest ~36mm. Interval development of 4 mm nodule in RLL  PFT: 03/01/22 FVC 2.56 (89%) FEV1 2.1 (96%) Ratio 79  TLC 86% DLCO 108%. Partial bronchodilator response however not significant. This does not preclude benefit of inhalers if perceived. Interpretation: Normal PFTs  Labs: CBC No results found for: WBC, RBC, HGB, HCT, PLT, MCV, MCH, MCHC, RDW, LYMPHSABS, MONOABS, EOSABS, BASOSABS      Assessment & Plan:   Discussion: Assessment & Plan Cough variant asthma - partial bronchodilator response on PFTs. Improved on intermittent low-dose ICS/LABA Chronic cough -  improving --Continue to reduce Breo 100-25 ONE puff ONCE a day every 3 three days. Ok to use as needed daily --Self-discontinued PPI due to ineffectiveness  Multiple lung nodules --Reviewed imaging with largest  nodule 7mm ans stable 15 months and new 4 mm nodule in right lower lobe --Likely benign with subcentimeter nodules with an inflammatory or infectious etiology in setting of reflux --ORDERED CT chest without contrast in 1 year (Aug 2026)   Health Maintenance Immunization History  Administered Date(s) Administered   INFLUENZA, HIGH DOSE SEASONAL PF 07/24/2022   Influenza Split 08/12/2020, 08/11/2021   Influenza,inj,Quad PF,6+ Mos 07/31/2017, 09/03/2018, 08/12/2019   PFIZER Comirnaty(Gray Top)Covid-19 Tri-Sucrose Vaccine 07/15/2022   PFIZER(Purple Top)SARS-COV-2 Vaccination 01/05/2020, 01/26/2020, 07/27/2020, 01/29/2021   PNEUMOCOCCAL CONJUGATE-20 01/02/2022   Pfizer Covid-19 Vaccine Bivalent Booster 110yrs & up 07/26/2021, 03/06/2022   Tdap 01/03/2010, 12/25/2019   Zoster, Live 01/23/2017, 07/16/2017   CT Lung Screen - not qualified  Orders Placed This Encounter  Procedures   CT Chest Wo Contrast    Standing Status:   Future    Expiration Date:   07/21/2025    Scheduling Instructions:     Schedule in August 2026    Preferred imaging location?:   [facility]  Meds ordered this encounter  Medications   fluticasone  furoate-vilanterol (BREO ELLIPTA ) 100-25 MCG/ACT AEPB    Sig: Inhale 1 puff into the lungs daily.    Dispense:  60 each    Refill:  11    Return in about 1 year (around 07/21/2025) for after CT scan.  I have spent a total time of 35-minutes on the day of the appointment reviewing prior documentation, coordinating care and discussing medical diagnosis and plan with the patient/family. Imaging, labs and tests included in this note have been reviewed and interpreted independently by me. This note is generated using [employer] programming. Patient/family has given consent.  Deanette Tullius Slater Staff, MD [facility] 07/21/2024 7:57 PM

## 2024-09-01 ENCOUNTER — Ambulatory Visit (HOSPITAL_BASED_OUTPATIENT_CLINIC_OR_DEPARTMENT_OTHER)
Admission: RE | Admit: 2024-09-01 | Discharge: 2024-09-01 | Disposition: A | Discharge status: Home / Self Care | Source: Ambulatory Visit | Attending: Family Medicine | Admitting: Family Medicine

## 2024-09-01 DIAGNOSIS — M810 Age-related osteoporosis without current pathological fracture: Secondary | ICD-10-CM | POA: Diagnosis present

## 2024-09-24 ENCOUNTER — Other Ambulatory Visit
